# Patient Record
Sex: Female | Born: 1935 | Hispanic: No | Marital: Married | Smoking: Never smoker
Health system: Southern US, Community
[De-identification: ages and names within clinical notes are randomized; demographics above are authoritative.]

## PROBLEM LIST (undated history)

## (undated) DIAGNOSIS — Z923 Personal history of irradiation: Secondary | ICD-10-CM

## (undated) DIAGNOSIS — C801 Malignant (primary) neoplasm, unspecified: Secondary | ICD-10-CM

## (undated) DIAGNOSIS — N390 Urinary tract infection, site not specified: Secondary | ICD-10-CM

## (undated) DIAGNOSIS — C50919 Malignant neoplasm of unspecified site of unspecified female breast: Secondary | ICD-10-CM

## (undated) HISTORY — DX: Urinary tract infection, site not specified: N39.0

---

## 2001-07-21 DIAGNOSIS — C50919 Malignant neoplasm of unspecified site of unspecified female breast: Secondary | ICD-10-CM

## 2001-07-21 HISTORY — PX: BREAST BIOPSY: SHX20

## 2001-07-21 HISTORY — DX: Malignant neoplasm of unspecified site of unspecified female breast: C50.919

## 2004-04-01 ENCOUNTER — Other Ambulatory Visit: Payer: Self-pay

## 2004-09-30 ENCOUNTER — Ambulatory Visit: Payer: Self-pay | Admitting: General Surgery

## 2005-01-08 ENCOUNTER — Ambulatory Visit: Payer: Self-pay | Admitting: Oncology

## 2005-10-13 ENCOUNTER — Ambulatory Visit: Payer: Self-pay | Admitting: General Surgery

## 2006-01-12 ENCOUNTER — Ambulatory Visit: Payer: Self-pay | Admitting: Oncology

## 2006-06-10 ENCOUNTER — Encounter: Payer: Self-pay | Admitting: Unknown Physician Specialty

## 2006-06-20 ENCOUNTER — Encounter: Payer: Self-pay | Admitting: Unknown Physician Specialty

## 2006-07-21 ENCOUNTER — Encounter: Payer: Self-pay | Admitting: Unknown Physician Specialty

## 2006-10-16 ENCOUNTER — Ambulatory Visit: Payer: Self-pay | Admitting: General Surgery

## 2006-11-05 ENCOUNTER — Ambulatory Visit: Payer: Self-pay

## 2007-09-08 ENCOUNTER — Ambulatory Visit: Payer: Self-pay | Admitting: Ophthalmology

## 2007-09-21 ENCOUNTER — Ambulatory Visit: Payer: Self-pay | Admitting: Ophthalmology

## 2007-10-18 ENCOUNTER — Ambulatory Visit: Payer: Self-pay | Admitting: General Surgery

## 2007-12-14 ENCOUNTER — Other Ambulatory Visit: Payer: Self-pay

## 2007-12-14 ENCOUNTER — Emergency Department: Payer: Self-pay | Admitting: Emergency Medicine

## 2007-12-21 ENCOUNTER — Ambulatory Visit: Payer: Self-pay | Admitting: Internal Medicine

## 2007-12-29 ENCOUNTER — Ambulatory Visit: Payer: Self-pay | Admitting: Internal Medicine

## 2008-06-30 ENCOUNTER — Ambulatory Visit: Payer: Self-pay | Admitting: Internal Medicine

## 2008-10-18 ENCOUNTER — Ambulatory Visit: Payer: Self-pay | Admitting: Internal Medicine

## 2008-10-24 ENCOUNTER — Ambulatory Visit: Payer: Self-pay | Admitting: Ophthalmology

## 2008-10-24 ENCOUNTER — Ambulatory Visit: Payer: Self-pay | Admitting: Cardiology

## 2008-10-30 ENCOUNTER — Ambulatory Visit: Payer: Self-pay | Admitting: Ophthalmology

## 2009-10-24 ENCOUNTER — Ambulatory Visit: Payer: Self-pay | Admitting: Internal Medicine

## 2010-10-28 ENCOUNTER — Ambulatory Visit: Payer: Self-pay | Admitting: Internal Medicine

## 2011-10-06 ENCOUNTER — Ambulatory Visit: Payer: Self-pay | Admitting: Internal Medicine

## 2011-11-07 ENCOUNTER — Ambulatory Visit: Payer: Self-pay | Admitting: Internal Medicine

## 2012-04-09 DIAGNOSIS — N390 Urinary tract infection, site not specified: Secondary | ICD-10-CM | POA: Insufficient documentation

## 2012-04-09 DIAGNOSIS — N3946 Mixed incontinence: Secondary | ICD-10-CM | POA: Insufficient documentation

## 2012-04-09 DIAGNOSIS — R399 Unspecified symptoms and signs involving the genitourinary system: Secondary | ICD-10-CM | POA: Insufficient documentation

## 2012-06-14 ENCOUNTER — Observation Stay: Payer: Self-pay | Admitting: Internal Medicine

## 2012-06-14 LAB — COMPREHENSIVE METABOLIC PANEL
Anion Gap: 8 (ref 7–16)
BUN: 13 mg/dL (ref 7–18)
Calcium, Total: 9.2 mg/dL (ref 8.5–10.1)
Chloride: 98 mmol/L (ref 98–107)
Creatinine: 0.74 mg/dL (ref 0.60–1.30)
EGFR (African American): >60
EGFR (Non-African Amer.): >60
Potassium: 3.8 mmol/L (ref 3.5–5.1)
SGOT(AST): 18 U/L (ref 15–37)
SGPT (ALT): 18 U/L (ref 12–78)
Total Protein: 7.8 g/dL (ref 6.4–8.2)

## 2012-06-14 LAB — CK TOTAL AND CKMB (NOT AT ARMC): CK-MB: 1.1 ng/mL (ref 0.5–3.6)

## 2012-06-14 LAB — CBC WITH DIFFERENTIAL/PLATELET
Basophil #: 0 10*3/uL (ref 0.0–0.1)
Eosinophil #: 0.1 10*3/uL (ref 0.0–0.7)
HCT: 38 % (ref 35.0–47.0)
HGB: 13 g/dL (ref 12.0–16.0)
MCH: 30.9 pg (ref 26.0–34.0)
MCHC: 34.2 g/dL (ref 32.0–36.0)
MCV: 90 fL (ref 80–100)
Monocyte #: 0.7 x10 3/mm (ref 0.2–0.9)
Neutrophil %: 64.9 %
Platelet: 185 10*3/uL (ref 150–440)
RBC: 4.2 10*6/uL (ref 3.80–5.20)
RDW: 12.2 % (ref 11.5–14.5)

## 2012-06-14 LAB — TROPONIN I: Troponin-I: <0.02 ng/mL

## 2012-06-15 LAB — CBC WITH DIFFERENTIAL/PLATELET
Basophil #: 0 10*3/uL (ref 0.0–0.1)
Eosinophil #: 0.1 10*3/uL (ref 0.0–0.7)
Lymphocyte %: 26 %
Monocyte %: 8.9 %
Platelet: 176 10*3/uL (ref 150–440)
RDW: 12.2 % (ref 11.5–14.5)
WBC: 8.2 10*3/uL (ref 3.6–11.0)

## 2012-06-15 LAB — BASIC METABOLIC PANEL
BUN: 12 mg/dL (ref 7–18)
Calcium, Total: 9 mg/dL (ref 8.5–10.1)
Chloride: 102 mmol/L (ref 98–107)
Creatinine: 0.74 mg/dL (ref 0.60–1.30)
EGFR (Non-African Amer.): >60
Glucose: 77 mg/dL (ref 65–99)
Osmolality: 271 (ref 275–301)
Potassium: 3.3 mmol/L — ABNORMAL LOW (ref 3.5–5.1)
Sodium: 136 mmol/L (ref 136–145)

## 2012-06-15 LAB — URINALYSIS, COMPLETE
Bilirubin,UR: NEGATIVE
Glucose,UR: NEGATIVE mg/dL (ref 0–75)
Nitrite: NEGATIVE
Protein: NEGATIVE
RBC,UR: 1 /HPF (ref 0–5)
Specific Gravity: 1.01 (ref 1.003–1.030)
WBC UR: <1 /HPF (ref 0–5)

## 2012-11-10 ENCOUNTER — Ambulatory Visit: Payer: Self-pay | Admitting: Internal Medicine

## 2012-11-22 ENCOUNTER — Ambulatory Visit: Payer: Self-pay | Admitting: Internal Medicine

## 2013-02-23 ENCOUNTER — Ambulatory Visit: Payer: Self-pay | Admitting: Internal Medicine

## 2013-03-10 DIAGNOSIS — M79641 Pain in right hand: Secondary | ICD-10-CM | POA: Insufficient documentation

## 2013-03-11 DIAGNOSIS — G5600 Carpal tunnel syndrome, unspecified upper limb: Secondary | ICD-10-CM | POA: Insufficient documentation

## 2013-03-11 DIAGNOSIS — M19049 Primary osteoarthritis, unspecified hand: Secondary | ICD-10-CM | POA: Insufficient documentation

## 2013-06-20 ENCOUNTER — Ambulatory Visit: Payer: Self-pay | Admitting: Otolaryngology

## 2013-07-22 DIAGNOSIS — M19049 Primary osteoarthritis, unspecified hand: Secondary | ICD-10-CM | POA: Insufficient documentation

## 2013-09-01 DIAGNOSIS — I10 Essential (primary) hypertension: Secondary | ICD-10-CM | POA: Insufficient documentation

## 2013-11-30 ENCOUNTER — Ambulatory Visit: Payer: Self-pay | Admitting: Internal Medicine

## 2014-06-12 DIAGNOSIS — D352 Benign neoplasm of pituitary gland: Secondary | ICD-10-CM | POA: Insufficient documentation

## 2014-06-12 DIAGNOSIS — E7800 Pure hypercholesterolemia, unspecified: Secondary | ICD-10-CM | POA: Insufficient documentation

## 2014-06-12 DIAGNOSIS — M8580 Other specified disorders of bone density and structure, unspecified site: Secondary | ICD-10-CM | POA: Insufficient documentation

## 2014-06-12 DIAGNOSIS — D510 Vitamin B12 deficiency anemia due to intrinsic factor deficiency: Secondary | ICD-10-CM | POA: Insufficient documentation

## 2014-10-04 ENCOUNTER — Encounter: Payer: Self-pay | Admitting: Podiatry

## 2014-10-04 ENCOUNTER — Ambulatory Visit (INDEPENDENT_AMBULATORY_CARE_PROVIDER_SITE_OTHER): Payer: Medicare Other

## 2014-10-04 ENCOUNTER — Ambulatory Visit (INDEPENDENT_AMBULATORY_CARE_PROVIDER_SITE_OTHER): Payer: Medicare Other | Admitting: Podiatry

## 2014-10-04 VITALS — BP 141/79 | HR 82 | Resp 16 | Ht 66.0 in | Wt 155.0 lb

## 2014-10-04 DIAGNOSIS — M779 Enthesopathy, unspecified: Secondary | ICD-10-CM

## 2014-10-04 DIAGNOSIS — S92301A Fracture of unspecified metatarsal bone(s), right foot, initial encounter for closed fracture: Secondary | ICD-10-CM

## 2014-10-04 NOTE — Progress Notes (Signed)
   Subjective:    Patient ID: Adriana Moreno, female    DOB: 1933/12/26, 81 y.o.   MRN: [id number]  HPI Comments: "I have a pain on top of my foot"  Patient c/o aching dorsal forefoot right for about 3 weeks. She was playing golf and stepped in a hole and twisted the foot. She noticed it was bruised along the side. She has been soaking and wearing a brace. She also tried some arch supports. No help.  Foot Pain Associated symptoms include arthralgias and myalgias.      Review of Systems  HENT: Positive for hearing loss and tinnitus.   Musculoskeletal: Positive for myalgias, back pain, arthralgias and gait problem.  Neurological: Positive for dizziness.  All other systems reviewed and are negative.      Objective:   Physical Exam: I have reviewed her past medical history medications allergies surgery social history and review of systems. Pulses are strongly palpable bilateral. Decreased sensorium per vibratory sensation bilateral to the level of the knee. Deep tendon reflexes are intact bilateral muscle strength +5 over 5 dorsiflexion plantar flexors and inverters and everters all intrinsic musculature is intact. Orthopedic evaluation demonstrates all joints distal to the ankle had full range of motion without crepitation. She has mild tenderness on range of motion of the second and third metatarsophalangeal joints of the right foot to she states this is much improved. She does have pain on palpation at the fifth metatarsal base and the fourth metatarsal base without overlying edema or ecchymosis. Radiographic evaluation is suggested for a possible stress fracture of the base of the fifth metatarsal and the fourth metatarsal of the right foot. No comminution noted displacement is noted.        Assessment & Plan:  Assessment: Stress fracture bases of the fourth and fifth metatarsals of the right foot.  Plan: Place her in a Darco shoe. Follow up with her in 1 month for another set of  x-rays.

## 2014-11-01 ENCOUNTER — Ambulatory Visit: Payer: Medicare Other | Admitting: Podiatry

## 2014-11-03 ENCOUNTER — Other Ambulatory Visit: Payer: Self-pay | Admitting: Internal Medicine

## 2014-11-03 DIAGNOSIS — Z1231 Encounter for screening mammogram for malignant neoplasm of breast: Secondary | ICD-10-CM

## 2014-11-07 NOTE — H&P (Signed)
PATIENT NAME:  Adriana Moreno, Adriana Moreno MR#:  [id number] DATE OF BIRTH:  07-31-1933  DATE OF ADMISSION:  06/14/2012  REASON FOR ADMISSION: Neck pain with acute memory loss.   PRIMARY CARE PHYSICIAN: Dr. Lisette Grinder, III   HISTORY OF PRESENT ILLNESS: Mrs. Adriana Moreno is a very nice 81 year old female who has a history of high blood pressure, breast cancer status post lumpectomy and radiation,  chronic urinary tract infections for which she takes chronic antibiotics, osteoarthritis, and gastroesophageal reflux disease. She came to visit [place] today due to severe pain of the neck with stiffness that has been going on for over two weeks with worsening today. The patient states that right now she does not remember anything of what happened and why she is here, I discussed the case with the ER physician and the husband. Apparently the patient had the pain, again, for two weeks and it has been getting worse with radiation to the back of the head, to the top of the head, and significant stiffness. It was believed that she may have some torticollis.  At the [facility] office she was told to come and get checked in the ER due to worsening of the pain and the headache just to rule out any type of dissection of the posterior circulation.  The patient came over here. She had a CT angio of the neck, which was negative and did not show any significant abnormalities.  After that she was given some Valium, 2.5 mg IV, and the patient started getting confused right after that. She also had an order to get 5 mg p.o. which it says completed on the computer. I could not establish if this medication was given or not,  but it seems like she did receive it. After that, the patient started getting really confused and had significant acute amnesia of the events of today. For that reason I was asked to admit the patient. Today when I spoke to her the patient is actually getting better. She is starting to have more  clearing of her head but she still does not know what day it is today. After directing she knows that it is November. She knows that it is several days until Thanksgiving, but she did not know the exact date. She knew who she was and where she was, and she states that she does not necessarily remember why she was coming. She knew that she had pain in her neck but did not remember getting worse or coming to the doctor this morning. I had a long conversation with the husband who said that this has happened before, not to this degree, but she was still confused for a day about three years ago and maybe about six years ago. He does not remember the specifics of that. It could have been after a urine infection, it could have been after initiating new medications. Overall the patient is fine and she states that her headache started today on the top of the head down to the posterior area of her head, running down to her neck and it resolved after the medication was given "Valium".   REVIEW OF SYSTEMS: CONSTITUTIONAL: Denies any fever, weakness, weight loss, or weight gain. Positive neck pain. EYES: Denies any blurry vision, double vision, cataracts, or inflammation. ENT: No tinnitus. No epistaxis. No discharge. No difficulty swallowing. No drop of the corners of the mouth. No difficulty closing or opening her eyes. RESPIRATORY: No cough, wheezing, hemoptysis, or pneumonia. CARDIOVASCULAR: No  chest pain, orthopnea, edema, arrhythmias, or palpitations. GI: No nausea or vomiting. No abdominal pain. No rectal bleeding or melena. GU: No dysuria or hematuria. No frequency or incontinence. The patient has history of chronic UTIs, but they have been controlled and she has not had one in the last couple of years after she started her treatment with nitrofurantoin. GYN: History of breast cancer status post lumpectomy and radiation. ENDOCRINE: No polyuria, polydipsia, or polyphagia. No cold or heat intolerance. HEME/LYMPH: No  anemia, easy bruising, or bleeding. No swollen glands. SKIN: Without any significant rashes or lesions. MUSCULOSKELETAL: Positive neck pain. Positive for arthritis of her hands and knees. NEUROLOGIC: No numbness or tingling. No weakness. No dysarthria. No dementia. No CVAs or transient ischemic attacks. Positive for headache as mentioned above. PSYCHIATRIC: No anxiety or depression.   PAST MEDICAL HISTORY:  1. Hypertension.  2. Breast cancer.  3. Chronic urinary tract infections.  4. Seasonal allergies.  5. Osteoarthritis.  6. Gastroesophageal reflux disease.   ALLERGIES: The patient is allergic to codeine, adhesive Band-Aids, and Eryc  which give her a rash.   CURRENT MEDICATIONS:  1. Aspirin 81 mg daily. 2. Celebrex 200 mg 2 times a day.  3. Tramadol 50 mg every 4 hours p.r.n. pain.  4. Zyrtec 10 mg once daily.  5. Indapamide 1.5 mg once daily. 6. Omeprazole 20 mg once daily. 7. Nitrofurantoin 50 mg once daily.  8. Caltrate 600 mg plus vitamin D.   FAMILY HISTORY: Myocardial infarction in her father. Her mother had uterine cancer. No strokes.   SOCIAL HISTORY: Denies any tobacco, alcohol, or drug use. She lives with her husband. They are both retired.   PAST SURGICAL HISTORY:  1. Hand surgery for her osteoarthritis.  2. Hysterectomy.  3. Lumpectomy followed by radiation due to breast cancer.   PHYSICAL EXAMINATION:  VITAL SIGNS: Blood pressure 179/98, pulse 95, respiratory rate 20, temperature 97.3.   GENERAL: The patient is alert and oriented x 2. She knows where she is. She knows her name but she does not know exactly the date. She is able to be redirected. Her speech is clear. No dysarthria. It is goal directed. She does have some mild amnesia.   HEENT: Her pupils are equal and reactive. Extraocular movements are intact. Mucosa are moist. There are no oral lesions. No oropharyngeal exudates. Anicteric sclerae. Conjunctivae are pink.   NECK: Supple. No JVD. No thyromegaly.  No adenopathy. No carotid bruits. Some pain to palpation of the scalene and trapesioum muscles. They are little bit tight but there is no significant stiffness.   CARDIOVASCULAR: Regular rate and rhythm. No murmurs, rubs, or gallops are appreciated. No displacement of PMI. No thrills.   LUNGS: Clear without any wheezing or crepitus. No use of accessory muscles.   ABDOMEN: Soft, nontender, nondistended. No hepatosplenomegaly. No masses. Bowel sounds are positive.   EXTREMITIES: No edema, no cyanosis, no clubbing.   GU: Deferred.   SKIN: No rashes or petechiae.   LYMPHATIC: Negative for lymphadenopathy in neck or supraclavicular areas.   NEUROLOGIC: Cranial nerves II through XII are intact. Strength is five out of five in four extremities. No nystagmus. No difficulty doing rapid alternating movements. No dysmetria. Overall grossly normal exam. Sensation is normal distally. Capillary refill is less than 3 seconds.   PSYCH: Mood is normal without any signs of depression. The patient is alert and oriented times two as mentioned above.   MUSCULOSKELETAL: Negative for significant joint deformity or joint effusions.  RESULTS:  CT angiogram of the neck is normal. CT scan of the head: Unable to see detail due to IV contrast but no significant acute abnormalities. Chest x-ray within normal limits. It showed some hyperinflation compatible with chronic obstructive pulmonary disease although the patient never smoked and does not have any respiratory problems. Her sodium is 134. Her creatinine is 0.74, glucose 84. Electrolytes are overall within normal limits. Troponins are negative. White count is 8.4, hemoglobin is 13, and platelets are 185. Her EKG shows normal sinus rhythm. No ST depression or elevation. No signs of significant left ventricular hypertrophy.   ASSESSMENT AND PLAN: 81 year old female with history of neck pain who also has hypertension breast cancer, chronic UTIs, allergies,  osteoarthritis, and gastroesophageal reflux disease.  She came to the ER to rule out any vascular problems in her neck as sent by [facility] orthopedics.  The patient had a normal CT angio.  She received Valium and after that had significant acute memory loss and confusion. The patient is admitted for observation.  1. Neck pain, likely due to musculoskeletal problem, torticollis.  The patient received Valium and actually her pain went away.  Her neck is more mobile and relaxed and her headache went away. At this moment the only problem is her memory loss. We are going to continue with pain control with tramadol, Tylenol, and I am going to add some methocarbamol which does not have as many side effects as the benzodiazepines.  2. Altered mental status/acute memory loss, likely due to medication management. This does not seem to be a transient ischemic attack or a stroke, although we are going to do neuro checks. The patient had episodes like this that happened three years ago and six years ago. They lasted hours. Again, we do not know the details but she had urine infections and the husband said it could be related to one of those episodes.  If there are no changes in her mental status we are going to order an MRI in the morning but first we are going to let Dr. Gilford Rile check on her since he is her primary care doctor and figure out from there if she requires any more testing. She already had a  CT angio of the neck and I am not going to order an ultrasound of the neck. Consideration of getting an echocardiogram is there, too, but since the patient had this event related to medication, I doubt that we need to proceed with that all that work-up here as an inpatient.  3. Hypertension. Continue treatment with indapamide. I am not going to try to lower her blood pressure too much or too fast in the event of a transient ischemic attack. We are only going to treat blood pressure with p.r.n. medications if her blood  pressure is above 220.  4. Low sodium of 134 likely due to blood pressure medication indapamide.  5. Gastroesophageal reflux disease. Continue PPI.  6. Chronic urinary tract infections. Continue use of Macrobid.  7. Other medical problems seem to be stable.   TIME SPENT: I spent about 45 minutes with this admission. We are going to sign off the care to Dr. Gilford Rile in the morning. The patient is a FULL CODE.    ____________________________ [place] Sink, MD [id number] D: 06/14/2012 16:50:44 ET T: 06/14/2012 17:13:48 ET JOB#: [id number]  cc:  Sink, MD, <Dictator> John B. Sarina Ser, MD Carly Sabo America Brown MD ELECTRONICALLY SIGNED 06/15/2012 12:28

## 2014-12-04 ENCOUNTER — Ambulatory Visit
Admission: RE | Admit: 2014-12-04 | Discharge: 2014-12-04 | Disposition: A | Discharge status: Home / Self Care | Payer: Medicare Other | Source: Ambulatory Visit | Attending: Internal Medicine | Admitting: Internal Medicine

## 2014-12-04 ENCOUNTER — Other Ambulatory Visit: Payer: Self-pay | Admitting: Internal Medicine

## 2014-12-04 DIAGNOSIS — Z1231 Encounter for screening mammogram for malignant neoplasm of breast: Secondary | ICD-10-CM | POA: Insufficient documentation

## 2014-12-04 HISTORY — DX: Malignant (primary) neoplasm, unspecified: C80.1

## 2014-12-11 DIAGNOSIS — E559 Vitamin D deficiency, unspecified: Secondary | ICD-10-CM | POA: Insufficient documentation

## 2014-12-20 ENCOUNTER — Ambulatory Visit (INDEPENDENT_AMBULATORY_CARE_PROVIDER_SITE_OTHER): Payer: Medicare Other

## 2014-12-20 ENCOUNTER — Encounter: Payer: Self-pay | Admitting: Podiatry

## 2014-12-20 ENCOUNTER — Ambulatory Visit (INDEPENDENT_AMBULATORY_CARE_PROVIDER_SITE_OTHER): Payer: Medicare Other | Admitting: Podiatry

## 2014-12-20 VITALS — Ht 66.0 in | Wt 145.0 lb

## 2014-12-20 DIAGNOSIS — G5761 Lesion of plantar nerve, right lower limb: Secondary | ICD-10-CM | POA: Diagnosis not present

## 2014-12-20 DIAGNOSIS — M79671 Pain in right foot: Secondary | ICD-10-CM | POA: Diagnosis not present

## 2014-12-20 NOTE — Progress Notes (Signed)
She presents today for follow-up of her fracture fourth metatarsal base of the right foot. She states that the pain feels about the same that it did accept is a little more toward the toes. She was unable to wear the Darco shoe due to falls.  Objective: Vital signs stable alert and oriented 3. She has pain on palpation to the base of the fourth metatarsal but minimally so. She has pain on palpation to the third interdigital space right foot. Radiograph confirms slowly healing dress fracture fourth met base right.  Assessment: Well-healing fracture fourth met base right and mortise neuroma third interdigital space right.  Plan: Kenalog injection third and general space of the right foot follow up with her in 3-4 weeks.

## 2015-01-10 ENCOUNTER — Encounter: Payer: Self-pay | Admitting: Podiatry

## 2015-01-10 ENCOUNTER — Ambulatory Visit (INDEPENDENT_AMBULATORY_CARE_PROVIDER_SITE_OTHER): Payer: Medicare Other | Admitting: Podiatry

## 2015-01-10 VITALS — BP 157/85 | HR 67 | Resp 16

## 2015-01-10 DIAGNOSIS — G5761 Lesion of plantar nerve, right lower limb: Secondary | ICD-10-CM

## 2015-01-10 NOTE — Progress Notes (Signed)
She presents today for follow-up of a neuroma to the third interdigital space of the right foot. She states that the last injection helped considerably however it seems to have worn off. She states that her pain has returned and like to consider another injection because of an upcoming engagement that she has.  Objective: Vital signs are stable alert and oriented 3. Pulses are palpable right foot. Palpable Mulder's click to the third interdigital space of the right foot.  Assessment: Morton's neuroma third interdigital space right foot.  Plan: Injected Kenalog to the third interdigital space of the right foot. We did discuss the need for alcohol injections which she would like to consider in the future.

## 2015-02-07 ENCOUNTER — Ambulatory Visit: Payer: Medicare Other | Admitting: Podiatry

## 2015-02-14 ENCOUNTER — Ambulatory Visit (INDEPENDENT_AMBULATORY_CARE_PROVIDER_SITE_OTHER): Payer: Medicare Other | Admitting: Podiatry

## 2015-02-14 VITALS — BP 143/76 | HR 78 | Resp 16

## 2015-02-14 DIAGNOSIS — G5761 Lesion of plantar nerve, right lower limb: Secondary | ICD-10-CM | POA: Diagnosis not present

## 2015-02-14 NOTE — Progress Notes (Signed)
She presents today for follow-up of her neuroma to her right foot. She states that does not seem to be getting any better with the cortisone injection. She would like to try different treatment plan if at all possible.  Objective: I'll signs are stable she is alert and oriented 3. Pulses are palpable. She has a palpable Mulder's click to the third interdigital space of the right foot.  Assessment: Chronic neuroma third interdigital space right foot.  Plan: Injected her first dose of dehydrated alcohol to the third interdigital space the right foot. Follow up with her in 3 weeks.

## 2015-03-07 ENCOUNTER — Encounter: Payer: Self-pay | Admitting: Podiatry

## 2015-03-07 ENCOUNTER — Ambulatory Visit (INDEPENDENT_AMBULATORY_CARE_PROVIDER_SITE_OTHER): Payer: Medicare Other

## 2015-03-07 ENCOUNTER — Ambulatory Visit (INDEPENDENT_AMBULATORY_CARE_PROVIDER_SITE_OTHER): Payer: Medicare Other | Admitting: Podiatry

## 2015-03-07 VITALS — BP 168/94 | HR 75 | Resp 16

## 2015-03-07 DIAGNOSIS — G5761 Lesion of plantar nerve, right lower limb: Secondary | ICD-10-CM

## 2015-03-07 DIAGNOSIS — S92301A Fracture of unspecified metatarsal bone(s), right foot, initial encounter for closed fracture: Secondary | ICD-10-CM | POA: Diagnosis not present

## 2015-03-07 MED ORDER — TRAMADOL HCL 50 MG PO TABS: 50.0000 mg | ORAL_TABLET | Freq: Three times a day (TID) | ORAL | Status: DC | PRN

## 2015-03-07 NOTE — Progress Notes (Signed)
She presents today for follow-up of her Morton's neuroma third interdigital space right foot. Today we'll be her second dose of dehydrated alcohol. She is questioning whether this is going to work or not she states that seems to be somewhat worse than it was previously she's also stating that there is a new area of tenderness as she points to the first intermetatarsal space of the right foot. She denies any trauma but states that it just started hurting and she can embark hardly bear weight on it.  Objective: 81 year old white female in no acute distress alert and oriented vital signs are stable. Pulses are strongly palpable right. Neurologic sensorium is intact with palpable Mulder's click to the third interdigital space of the right foot. She has tenderness on palpation to these second as well as the intermetatarsal space right foot. Radiographs 3 views of the right foot taken in the office today do not demonstrate any type of osseus abnormalities that are consistent with her symptoms. Osteoarthritic changes of the first metatarsal and the medial cuneiform joint with dorsal spurring is noted. This questions whether she has entrapment of the deep peroneal nerve.  Assessment: Morton's neuroma with no improvement third interdigital space right foot. Probable neuritis first intermetatarsal space right foot with questionable deep peroneal nerve neuritis.  Plan: Discussed etiology pathology conservative versus surgical therapies injected her second dose of dehydrated alcohol to the third interdigital space of the right foot. I injected 20 mg of cortisone to the first intermetatarsal space of the right foot for the neuritis. I will follow up with her in 3 weeks for reinjection.

## 2015-03-28 ENCOUNTER — Encounter: Payer: Self-pay | Admitting: Podiatry

## 2015-03-28 ENCOUNTER — Ambulatory Visit (INDEPENDENT_AMBULATORY_CARE_PROVIDER_SITE_OTHER): Payer: Medicare Other | Admitting: Podiatry

## 2015-03-28 VITALS — BP 131/74 | HR 82 | Resp 18

## 2015-03-28 DIAGNOSIS — G5761 Lesion of plantar nerve, right lower limb: Secondary | ICD-10-CM

## 2015-03-28 DIAGNOSIS — G5791 Unspecified mononeuropathy of right lower limb: Secondary | ICD-10-CM

## 2015-03-28 NOTE — Progress Notes (Signed)
She presents today after her second injection of dehydrated alcohol for her neuroma right foot. She states that I can see is slowly getting better.  Objective: Vital signs are stable she is alert and oriented 3 pulses are palpable. She still has some tenderness on palpation of the neuroma third interdigital space of the right foot.  Assessment: Neuroma third interdigital space right foot.  Plan: Injected her third dose of dehydrated alcohol to the third interdigital space of the right foot today I will follow up with her in 3 weeks.

## 2015-04-18 ENCOUNTER — Encounter: Payer: Self-pay | Admitting: Podiatry

## 2015-04-18 ENCOUNTER — Ambulatory Visit (INDEPENDENT_AMBULATORY_CARE_PROVIDER_SITE_OTHER): Payer: Medicare Other | Admitting: Podiatry

## 2015-04-18 VITALS — BP 94/50 | HR 90 | Resp 16

## 2015-04-18 DIAGNOSIS — G5761 Lesion of plantar nerve, right lower limb: Secondary | ICD-10-CM

## 2015-04-18 NOTE — Progress Notes (Signed)
She presents today for follow-up of a neuroma to the third interdigital space of the right foot. She states that I think it's getting better she states that some days are worse than others. She presents today for her fourth injection dehydrated alcohol.  Objective: Vital signs are stable she is alert and oriented 3 she still has pain on palpation to the third interdigital space however I no longer feel a Mulder's click.  Assessment: Neuroma third interdigital space right foot just between the metatarsal heads.  Plan: Injected her fourth dose of dehydrated alcohol to the point of maximal tenderness today right foot. Follow up with her in 3 weeks Roselind Messier DPM

## 2015-05-09 ENCOUNTER — Encounter: Payer: Self-pay | Admitting: Podiatry

## 2015-05-09 ENCOUNTER — Ambulatory Visit (INDEPENDENT_AMBULATORY_CARE_PROVIDER_SITE_OTHER): Payer: Medicare Other | Admitting: Podiatry

## 2015-05-09 VITALS — BP 151/91 | HR 73 | Resp 16

## 2015-05-09 DIAGNOSIS — G5761 Lesion of plantar nerve, right lower limb: Secondary | ICD-10-CM

## 2015-05-09 NOTE — Progress Notes (Signed)
She presents today for her fifth injection to the neuroma third interdigital space of the right foot. She states that after the fourth injection she really not cannot tell any difference at all.  Objective: Vital signs are stable she is alert and oriented 3 palpable Mulder's click third interdigital space right foot. Pulses are strongly palpable.  Assessment: Neuroma third interdigital space right foot. Chronic nature.  Plan: Injected her fifth dose of dehydrated alcohol to the third interdigital space of the right foot today after sterile Betadine skin prep. We also discussed the possible need for surgical intervention should this with an sixth injection not work as well. I will follow-up with her in 3 weeks for discussion.

## 2015-05-30 ENCOUNTER — Encounter: Payer: Self-pay | Admitting: Podiatry

## 2015-05-30 ENCOUNTER — Ambulatory Visit (INDEPENDENT_AMBULATORY_CARE_PROVIDER_SITE_OTHER): Payer: Medicare Other | Admitting: Podiatry

## 2015-05-30 VITALS — BP 149/82 | HR 73 | Resp 16

## 2015-05-30 DIAGNOSIS — G5761 Lesion of plantar nerve, right lower limb: Secondary | ICD-10-CM

## 2015-05-30 NOTE — Progress Notes (Signed)
She presents today for follow-up of neuroma third interdigital space of the right foot. She states it is not getting any better and I will like to consider surgical excision. She denies changes in her past medical history medications and allergies. She states that she is in good health with no complications. States that she just had surgery a little over a year ago for her fingers.  Objective: Vital signs are stable alert and oriented 3. Pulses are palpable. She has a palpable Mulder's click third interdigital space of the right foot. Her toe deformities are noted. Muscle strength is normal right foot.  Assessment: Chronic neuroma third interdigital space right foot.  Plan: Injected dehydrated alcohol today. We discussed the need for surgical intervention should this alcohol injection not work. We discussed surgical excision neuroma third interdigital space of the right foot under general anesthesia. She understands this and is amenable to it we discussed possible postop complications which may include but are not limited to postop pain bleeding swelling infection recurrence and need for further surgery loss of digit also for limb loss of life. She signed all 3 pages of the consent form and we will try to schedule surgery prior to the holidays.

## 2015-05-30 NOTE — Patient Instructions (Signed)
Pre-Operative Instructions  Congratulations, you have decided to take an important step to improving your quality of life.  You can be assured that the doctors of [facility] will be with you every step of the way.  1. Plan to be at the surgery center/hospital at least 1 (one) hour prior to your scheduled time unless otherwise directed by the surgical center/hospital staff.  You must have a responsible adult accompany you, remain during the surgery and drive you home.  Make sure you have directions to the surgical center/hospital and know how to get there on time. 2. For hospital based surgery you will need to obtain a history and physical form from your family physician within 1 month prior to the date of surgery- we will give you a form for you primary physician.  3. We make every effort to accommodate the date you request for surgery.  There are however, times where surgery dates or times have to be moved.  We will contact you as soon as possible if a change in schedule is required.   4. No Aspirin/Ibuprofen for one week before surgery.  If you are on aspirin, any non-steroidal anti-inflammatory medications (Mobic, Aleve, Ibuprofen) you should stop taking it 7 days prior to your surgery.  You make take Tylenol  For pain prior to surgery.  5. Medications- If you are taking daily heart and blood pressure medications, seizure, reflux, allergy, asthma, anxiety, pain or diabetes medications, make sure the surgery center/hospital is aware before the day of surgery so they may notify you which medications to take or avoid the day of surgery. 6. No food or drink after midnight the night before surgery unless directed otherwise by surgical center/hospital staff. 7. No alcoholic beverages 24 hours prior to surgery.  No smoking 24 hours prior to or 24 hours after surgery. 8. Wear loose pants or shorts- loose enough to fit over bandages, boots, and casts. 9. No slip on shoes, sneakers are best. 10. Bring  your boot with you to the surgery center/hospital.  Also bring crutches or a walker if your physician has prescribed it for you.  If you do not have this equipment, it will be provided for you after surgery. 11. If you have not been contracted by the surgery center/hospital by the day before your surgery, call to confirm the date and time of your surgery. 12. Leave-time from work may vary depending on the type of surgery you have.  Appropriate arrangements should be made prior to surgery with your employer. 13. Prescriptions will be provided immediately following surgery by your doctor.  Have these filled as soon as possible after surgery and take the medication as directed. 14. Remove nail polish on the operative foot. 15. Wash the night before surgery.  The night before surgery wash the foot and leg well with the antibacterial soap provided and water paying special attention to beneath the toenails and in between the toes.  Rinse thoroughly with water and dry well with a towel.  Perform this wash unless told not to do so by your physician.  Enclosed: 1 Ice pack (please put in freezer the night before surgery)   1 Hibiclens skin cleaner   Pre-op Instructions  If you have any questions regarding the instructions, do not hesitate to call our office.  [place]: [address] [place], [place] [address] [phone]  [place]: [address] [place], [place] [address] [phone]  [place]: [address]  [place], [place] [address] [phone]  Dr. Richard   Tuchman DPM, Dr. Norman Regal DPM Dr. Richard Sikora DPM, Dr. M. Todd Hyatt DPM, Dr. Kathryn Egerton DPM 

## 2015-06-07 ENCOUNTER — Telehealth: Payer: Self-pay | Admitting: *Deleted

## 2015-06-07 NOTE — Telephone Encounter (Signed)
Authorization was obtained for surgery scheduled for 06/21/2015 for cpt 28080.  Authorization number is [id number].  Authorization was faxed to Caren Griffins at [facility].

## 2015-06-21 ENCOUNTER — Other Ambulatory Visit: Payer: Self-pay | Admitting: Podiatry

## 2015-06-21 DIAGNOSIS — G5760 Lesion of plantar nerve, unspecified lower limb: Secondary | ICD-10-CM | POA: Diagnosis not present

## 2015-06-21 MED ORDER — TRAMADOL HCL 50 MG PO TABS: 50.0000 mg | ORAL_TABLET | Freq: Three times a day (TID) | ORAL | Status: AC | PRN

## 2015-06-21 MED ORDER — CEPHALEXIN 500 MG PO CAPS: 500.0000 mg | ORAL_CAPSULE | Freq: Three times a day (TID) | ORAL | Status: DC

## 2015-06-27 ENCOUNTER — Encounter: Payer: Self-pay | Admitting: Podiatry

## 2015-06-27 ENCOUNTER — Ambulatory Visit (INDEPENDENT_AMBULATORY_CARE_PROVIDER_SITE_OTHER): Payer: Medicare Other | Admitting: Podiatry

## 2015-06-27 VITALS — BP 151/87 | HR 85 | Resp 18

## 2015-06-27 DIAGNOSIS — Z9889 Other specified postprocedural states: Secondary | ICD-10-CM

## 2015-06-27 DIAGNOSIS — G5761 Lesion of plantar nerve, right lower limb: Secondary | ICD-10-CM

## 2015-06-27 NOTE — Progress Notes (Signed)
She presents today for follow-up of a neurectomy third interdigital space right foot. She denies fever chills nausea vomiting muscle aches and pains and states that he really has not hurt.  Objective: Vital signs stable alert and 3. Pulses are strongly palpable. Neurologic sensorium is intact. Sutures are intact and margins appear to be well coapted. No signs of infection.  Assessment: Well-healing surgical foot right.  Plan: Redress today dry sterile compressive dressing. Continue to keep this dry and clean as well as elevated follow-up with me in 1 week for suture removal.

## 2015-07-09 ENCOUNTER — Ambulatory Visit (INDEPENDENT_AMBULATORY_CARE_PROVIDER_SITE_OTHER): Payer: Medicare Other | Admitting: Podiatry

## 2015-07-09 DIAGNOSIS — G5761 Lesion of plantar nerve, right lower limb: Secondary | ICD-10-CM

## 2015-07-09 DIAGNOSIS — M79673 Pain in unspecified foot: Secondary | ICD-10-CM

## 2015-07-09 DIAGNOSIS — Z9889 Other specified postprocedural states: Secondary | ICD-10-CM

## 2015-07-09 NOTE — Progress Notes (Signed)
She presents today little more than 2 weeks status post neurectomy third interdigital space right foot. She states that the foot feels much better than it did previously.  Objective: All signs are stable she is alert and 3. Sutures are in place margins well coapted with removed sutures today March is remaining well coapted.  Assessment: Resolving neurectomy third interspace right foot.  Plan: Sutures were removed today and I did discuss with her in great detail neuropathy and his treatment options. I will follow-up with her in 2-3 weeks. I placed her in a compression garment as well as a Darco shoe.

## 2015-07-19 ENCOUNTER — Encounter: Payer: Self-pay | Admitting: Podiatry

## 2015-07-19 NOTE — Progress Notes (Signed)
DOS 06-21-15  Neurectomy 3rd interspace right

## 2015-07-25 ENCOUNTER — Encounter: Payer: Self-pay | Admitting: Podiatry

## 2015-07-25 ENCOUNTER — Ambulatory Visit (INDEPENDENT_AMBULATORY_CARE_PROVIDER_SITE_OTHER): Payer: Medicare Other

## 2015-07-25 ENCOUNTER — Ambulatory Visit (INDEPENDENT_AMBULATORY_CARE_PROVIDER_SITE_OTHER): Payer: Medicare Other | Admitting: Podiatry

## 2015-07-25 VITALS — BP 87/61 | HR 81 | Resp 18

## 2015-07-25 DIAGNOSIS — R52 Pain, unspecified: Secondary | ICD-10-CM | POA: Diagnosis not present

## 2015-07-25 DIAGNOSIS — G5761 Lesion of plantar nerve, right lower limb: Secondary | ICD-10-CM

## 2015-07-25 DIAGNOSIS — Z9889 Other specified postprocedural states: Secondary | ICD-10-CM

## 2015-07-25 NOTE — Progress Notes (Signed)
She presents today for follow-up of her neuroma surgery. Date of surgery 06/21/2015. She states that it feels like it did prior to surgery. And it was black and blue.  Objective: Vital signs are stable she is alert and oriented 3. No erythema edema cellulitis drainage or odor. Pulses are strongly palpable. On the reproducible pain is noted to be proximal in the intermetatarsal space for attacks end of the nerve into the muscle. She also has pain on palpation of the second metatarsal. Radiographs taken today confirm that there is no fracture to the metatarsal.  Assessment: Neuroma third interdigital space right foot.  Plan: We dispensed metatarsal pads to help alleviate symptoms in her forefoot. I will follow-up with her in the near future.

## 2015-08-03 ENCOUNTER — Encounter: Payer: Self-pay | Admitting: Podiatry

## 2015-08-29 ENCOUNTER — Ambulatory Visit (INDEPENDENT_AMBULATORY_CARE_PROVIDER_SITE_OTHER): Payer: Medicare Other | Admitting: Podiatry

## 2015-08-29 ENCOUNTER — Encounter: Payer: Self-pay | Admitting: Podiatry

## 2015-08-29 VITALS — BP 146/86 | HR 80 | Resp 18

## 2015-08-29 DIAGNOSIS — G5761 Lesion of plantar nerve, right lower limb: Secondary | ICD-10-CM | POA: Diagnosis not present

## 2015-08-29 DIAGNOSIS — G5781 Other specified mononeuropathies of right lower limb: Secondary | ICD-10-CM

## 2015-08-29 NOTE — Progress Notes (Signed)
She presents today for chief complaint of pain to the dorsal aspect of her right foot between her second and third toes. She had a neuroma removed between her third and fourth digits a while back and is done very well from that but still has some tenderness in that area. She presents today complaining of pain to the dorsal aspect of the foot.  Objective: Vital signs are stable she is alert and oriented 3. Pulses remain palpable. She has minimal tenderness on palpation of the third interdigital space with a surgery was previously performed. She does have pain on palpation to the second interdigital space and a palpable Mulder's click. I did review old radiographs once again to make sure that we did not have any fracture to the second metatarsal particularly since with swelling in the forefoot.  Assessment: Neuroma second interdigital space right foot.  Plan: I injected today with dexamethasone Kenalog and local anesthetic follow-up with her 1 month.

## 2015-09-26 ENCOUNTER — Encounter: Payer: Self-pay | Admitting: Podiatry

## 2015-09-26 ENCOUNTER — Encounter: Payer: Medicare Other | Admitting: Podiatry

## 2015-09-26 ENCOUNTER — Ambulatory Visit (INDEPENDENT_AMBULATORY_CARE_PROVIDER_SITE_OTHER): Payer: Medicare Other | Admitting: Podiatry

## 2015-09-26 DIAGNOSIS — G5781 Other specified mononeuropathies of right lower limb: Secondary | ICD-10-CM

## 2015-09-26 DIAGNOSIS — G5761 Lesion of plantar nerve, right lower limb: Secondary | ICD-10-CM

## 2015-09-26 NOTE — Progress Notes (Signed)
She presents today for follow-up of a neuroma between her neck and and third digits of her right foot. He previously had a neuroma removed from her third and fourth interdigital area several months ago and has since developed a mass overlying the incision site. She states that after the last steroid injection between the second third toes is starting to fill little better for a while before she started developing pain along the medial and lateral aspect of the hallux.  Objective: Vital signs stable alert and oriented 3. Pulses are palpable. Soft mass measuring the size of a dime overlying her old incision site between the third and fourth digits of the right foot. This appears to be a ganglion cyst or small seroma. This is not neuroma. She also has pain on palpation between the second third metatarsals and the first and second metatarsals she also has pain on palpation of the second metatarsal and first metatarsal cuneiform joint. This is an area of osteoarthritis and potential site for deep peroneal nerve entrapment.  Assessment: Neuroma second interdigital space and first interdigital space of the right foot.  Plan: I injected the area today with Kenalog and local anesthetic to both of these sites proximally. I will follow up with her in 4 weeks. May need to consider an MRI this doesn't better.

## 2015-10-24 ENCOUNTER — Encounter: Payer: Self-pay | Admitting: Podiatry

## 2015-10-24 ENCOUNTER — Ambulatory Visit (INDEPENDENT_AMBULATORY_CARE_PROVIDER_SITE_OTHER): Payer: Medicare Other | Admitting: Podiatry

## 2015-10-24 VITALS — BP 129/82 | HR 77 | Resp 16

## 2015-10-24 DIAGNOSIS — G5781 Other specified mononeuropathies of right lower limb: Secondary | ICD-10-CM

## 2015-10-24 DIAGNOSIS — G5761 Lesion of plantar nerve, right lower limb: Secondary | ICD-10-CM | POA: Diagnosis not present

## 2015-10-24 NOTE — Progress Notes (Signed)
She presents today for follow-up of neuroma to the second interdigital space of the right foot. She states that she still has a sharp shooting pain and I just THINK IS GOING TO GET WELL. SHE RELATES THAT AFTER THE LAST KENALOG INJECTION IT WAS GOOD FOR ABOUT 2 WEEKS AND THEN RECURRED.  OBJECTIVE: VITAL SIGNS ARE STABLE ALERT AND ORIENTED 3. PULSES ARE PALPABLE. SHE HAS A PALPABLE MULDER'S CLICK second interdigital space of the right foot.  Assessment neuroma second interspace right foot.  Plan: We initiated alcohol injections today secondary interdigital space right foot. Follow up with her in 3 weeks

## 2015-11-14 ENCOUNTER — Encounter: Payer: Self-pay | Admitting: Podiatry

## 2015-11-14 ENCOUNTER — Ambulatory Visit (INDEPENDENT_AMBULATORY_CARE_PROVIDER_SITE_OTHER): Payer: Medicare Other | Admitting: Podiatry

## 2015-11-14 DIAGNOSIS — G5761 Lesion of plantar nerve, right lower limb: Secondary | ICD-10-CM | POA: Diagnosis not present

## 2015-11-14 DIAGNOSIS — D3610 Benign neoplasm of peripheral nerves and autonomic nervous system, unspecified: Secondary | ICD-10-CM

## 2015-11-14 NOTE — Progress Notes (Signed)
She presents today for follow-up neuroma third interdigital space of the right foot. She states this really does not getting any better.  Objective: Vital signs are stable she is alert and oriented 3. The patient locates the pain from the plantar aspect of the foot the area was marked and injected with Kenalog and local anesthetic. This was injected plantarly.  Assessment: Neuroma third interdigital space right foot.  Plan: Injected Kenalog today follow up with her in 3 weeks. Consider MRI at that point.

## 2015-12-05 ENCOUNTER — Other Ambulatory Visit: Payer: Self-pay | Admitting: Internal Medicine

## 2015-12-05 ENCOUNTER — Ambulatory Visit (INDEPENDENT_AMBULATORY_CARE_PROVIDER_SITE_OTHER): Payer: Medicare Other | Admitting: Podiatry

## 2015-12-05 ENCOUNTER — Encounter: Payer: Self-pay | Admitting: Podiatry

## 2015-12-05 VITALS — BP 150/87 | HR 76 | Resp 18

## 2015-12-05 DIAGNOSIS — Z1231 Encounter for screening mammogram for malignant neoplasm of breast: Secondary | ICD-10-CM

## 2015-12-05 DIAGNOSIS — D3610 Benign neoplasm of peripheral nerves and autonomic nervous system, unspecified: Secondary | ICD-10-CM

## 2015-12-05 DIAGNOSIS — G5761 Lesion of plantar nerve, right lower limb: Secondary | ICD-10-CM

## 2015-12-05 NOTE — Progress Notes (Signed)
She presents today for follow-up of a neuroma third interdigital space right foot. She was injected from the plantar surface last visit with Kenalog and she states this seems to be doing better.  Objective: Vital signs are stable alert and oriented 3. Pulses are strongly palpable. She still has pain on palpation third interdigital space right foot dorsal and plantar.  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  Plan: I injected this once again today with a small amount of Kenalog and dexamethasone for the plantar surface to alleviate her symptoms. Follow-up with her in 3 weeks

## 2015-12-12 ENCOUNTER — Ambulatory Visit
Admission: RE | Admit: 2015-12-12 | Discharge: 2015-12-12 | Disposition: A | Discharge status: Home / Self Care | Payer: Medicare Other | Source: Ambulatory Visit | Attending: Internal Medicine | Admitting: Internal Medicine

## 2015-12-12 ENCOUNTER — Other Ambulatory Visit: Payer: Self-pay | Admitting: Internal Medicine

## 2015-12-12 DIAGNOSIS — Z1231 Encounter for screening mammogram for malignant neoplasm of breast: Secondary | ICD-10-CM

## 2015-12-12 HISTORY — DX: Malignant neoplasm of unspecified site of unspecified female breast: C50.919

## 2015-12-19 ENCOUNTER — Other Ambulatory Visit: Payer: Self-pay | Admitting: Internal Medicine

## 2015-12-19 DIAGNOSIS — R920 Mammographic microcalcification found on diagnostic imaging of breast: Secondary | ICD-10-CM

## 2015-12-26 ENCOUNTER — Ambulatory Visit (INDEPENDENT_AMBULATORY_CARE_PROVIDER_SITE_OTHER): Payer: Medicare Other | Admitting: Podiatry

## 2015-12-26 DIAGNOSIS — D3610 Benign neoplasm of peripheral nerves and autonomic nervous system, unspecified: Secondary | ICD-10-CM | POA: Diagnosis not present

## 2015-12-26 DIAGNOSIS — M79673 Pain in unspecified foot: Secondary | ICD-10-CM

## 2015-12-26 NOTE — Progress Notes (Signed)
She presents today for follow-up chief complaint of pain to the forefoot right foot. She states this is stabbing radiating pain. All other conservative therapies have failed to alleviate her symptoms from steroid injections to dehydrated alcohol injections. However I do feel that this is either a neuroma from her previous surgery to the third interdigital space or a new neuroma to the second interdigital space of the right foot.  Objective: Pain on palpation second third metatarsophalangeal spaces.   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  Plan: Requesting an MRI for visualization and surgical consideration.

## 2015-12-28 ENCOUNTER — Ambulatory Visit
Admission: RE | Admit: 2015-12-28 | Discharge: 2015-12-28 | Disposition: A | Discharge status: Home / Self Care | Payer: Medicare Other | Source: Ambulatory Visit | Attending: Internal Medicine | Admitting: Internal Medicine

## 2015-12-28 DIAGNOSIS — R920 Mammographic microcalcification found on diagnostic imaging of breast: Secondary | ICD-10-CM | POA: Diagnosis present

## 2015-12-28 DIAGNOSIS — R921 Mammographic calcification found on diagnostic imaging of breast: Secondary | ICD-10-CM | POA: Insufficient documentation

## 2016-01-01 ENCOUNTER — Other Ambulatory Visit: Payer: Self-pay | Admitting: Internal Medicine

## 2016-01-01 DIAGNOSIS — R921 Mammographic calcification found on diagnostic imaging of breast: Secondary | ICD-10-CM

## 2016-01-02 ENCOUNTER — Telehealth: Payer: Self-pay | Admitting: Podiatry

## 2016-01-02 ENCOUNTER — Telehealth: Payer: Self-pay | Admitting: *Deleted

## 2016-01-02 NOTE — Telephone Encounter (Signed)
Patient called said that dr. Milinda Pointer ordered an MRI for her last week and she has not heard from anyone about the date/time for this appt. Also, she said that he put on a splint on her toe to align and that has taken care of the pain. Patient doenst think MRI is necessary at this point but wants to talk to the nurse.

## 2016-01-02 NOTE — Telephone Encounter (Signed)
Informed pt the orders had been placed but not sent to the pre-cert department.  I informed pt her insurance company did not require prior authorization at this time and she could call for an appt.  Pt states the brace feels so good she will wait a week to see if she continues to improve, if not she will call.  I gave pt the central scheduling number for [facility]. Faxed orders to [facility] with insurance determination.

## 2016-01-02 NOTE — Telephone Encounter (Signed)
Orders to [facility] for pre-cert. 

## 2016-01-17 ENCOUNTER — Ambulatory Visit
Admission: RE | Admit: 2016-01-17 | Discharge: 2016-01-17 | Disposition: A | Discharge status: Home / Self Care | Payer: Medicare Other | Source: Ambulatory Visit | Attending: Internal Medicine | Admitting: Internal Medicine

## 2016-01-17 DIAGNOSIS — R921 Mammographic calcification found on diagnostic imaging of breast: Secondary | ICD-10-CM

## 2016-01-17 DIAGNOSIS — D242 Benign neoplasm of left breast: Secondary | ICD-10-CM | POA: Diagnosis not present

## 2016-01-18 HISTORY — PX: BREAST BIOPSY: SHX20

## 2016-01-21 LAB — SURGICAL PATHOLOGY

## 2016-01-25 ENCOUNTER — Ambulatory Visit
Admission: RE | Admit: 2016-01-25 | Discharge: 2016-01-25 | Disposition: A | Discharge status: Home / Self Care | Payer: Medicare Other | Source: Ambulatory Visit | Attending: Podiatry | Admitting: Podiatry

## 2016-01-25 DIAGNOSIS — D3610 Benign neoplasm of peripheral nerves and autonomic nervous system, unspecified: Secondary | ICD-10-CM | POA: Diagnosis not present

## 2016-01-25 DIAGNOSIS — M19071 Primary osteoarthritis, right ankle and foot: Secondary | ICD-10-CM | POA: Insufficient documentation

## 2016-01-25 DIAGNOSIS — R609 Edema, unspecified: Secondary | ICD-10-CM | POA: Diagnosis not present

## 2016-02-04 ENCOUNTER — Telehealth: Payer: Self-pay | Admitting: Podiatry

## 2016-02-04 ENCOUNTER — Telehealth: Payer: Self-pay | Admitting: *Deleted

## 2016-02-04 NOTE — Telephone Encounter (Deleted)
-----   Message from Garrel Ridgel, [place] sent at 02/03/2016  8:45 PM EDT ----- Send for an over read and inform the patient of the delay.

## 2016-02-04 NOTE — Telephone Encounter (Signed)
Pt returned missed call and I read your note to the pt about the over read of her mri. Pt is scheduled to see Dr Milinda Pointer on 7.31.17 to discuss results

## 2016-02-04 NOTE — Telephone Encounter (Addendum)
-----   Message from Garrel Ridgel, [place] sent at 02/03/2016  8:45 PM EDT ----- Send for an over read and inform the patient of the delay. 02/04/2016-Unable to leave message on cell voicemail not set up. Unable to leave a voicemail message explaining the delay due to overread, home phone rang over 1 minute without answering service. Faxed request for copy of MRI disc. 02/08/2016-Mailed copy of MRI disc to [facility].

## 2016-02-18 ENCOUNTER — Ambulatory Visit: Payer: Medicare Other | Admitting: Podiatry

## 2016-02-20 ENCOUNTER — Ambulatory Visit (INDEPENDENT_AMBULATORY_CARE_PROVIDER_SITE_OTHER): Payer: Medicare Other | Admitting: Podiatry

## 2016-02-20 ENCOUNTER — Encounter: Payer: Self-pay | Admitting: Podiatry

## 2016-02-20 DIAGNOSIS — D3610 Benign neoplasm of peripheral nerves and autonomic nervous system, unspecified: Secondary | ICD-10-CM | POA: Diagnosis not present

## 2016-02-20 MED ORDER — TRAMADOL HCL 50 MG PO TABS: 50.0000 mg | ORAL_TABLET | Freq: Three times a day (TID) | ORAL | 3 refills | Status: DC | PRN

## 2016-02-20 NOTE — Progress Notes (Signed)
She states today that she is having more right hip pain and lower back pain and that the neuroma has not changed. She is here for his her MRI review.  Objective: Vital signs are stable she is alert and oriented 3. Pulses are palpable. Neurologic sensorium still significant for neuroma third interdigital space right foot even after resection. MRI states that there is a small neuroma present.  Assessment: Neuroma third interdigital space right foot even after resection. Possibly associated with some sciatic or lower back issues from previous surgeries.  Plan: She'll follow up with [place] on an as-needed basis I did write her prescription for tramadol.

## 2016-03-05 ENCOUNTER — Encounter: Payer: Self-pay | Admitting: *Deleted

## 2016-12-03 ENCOUNTER — Other Ambulatory Visit: Payer: Self-pay | Admitting: Internal Medicine

## 2016-12-03 DIAGNOSIS — Z1231 Encounter for screening mammogram for malignant neoplasm of breast: Secondary | ICD-10-CM

## 2016-12-12 ENCOUNTER — Ambulatory Visit
Admission: RE | Admit: 2016-12-12 | Discharge: 2016-12-12 | Disposition: A | Discharge status: Home / Self Care | Payer: Medicare Other | Source: Ambulatory Visit | Attending: Internal Medicine | Admitting: Internal Medicine

## 2016-12-12 DIAGNOSIS — Z1231 Encounter for screening mammogram for malignant neoplasm of breast: Secondary | ICD-10-CM

## 2017-09-28 ENCOUNTER — Encounter: Payer: Self-pay | Admitting: Urology

## 2017-09-28 ENCOUNTER — Ambulatory Visit (INDEPENDENT_AMBULATORY_CARE_PROVIDER_SITE_OTHER): Payer: Medicare Other | Admitting: Urology

## 2017-09-28 VITALS — BP 139/86 | HR 70 | Ht 65.0 in | Wt 150.0 lb

## 2017-09-28 DIAGNOSIS — R310 Gross hematuria: Secondary | ICD-10-CM | POA: Diagnosis not present

## 2017-09-28 LAB — URINALYSIS, COMPLETE
BILIRUBIN UA: NEGATIVE
Glucose, UA: NEGATIVE
Ketones, UA: NEGATIVE
Nitrite, UA: NEGATIVE
PH UA: 6 (ref 5.0–7.5)
Protein, UA: NEGATIVE
Specific Gravity, UA: 1.02 (ref 1.005–1.030)
UUROB: 0.2 mg/dL (ref 0.2–1.0)

## 2017-09-28 LAB — MICROSCOPIC EXAMINATION: RBC, UA: NONE SEEN /hpf (ref 0–?)

## 2017-09-28 NOTE — Progress Notes (Signed)
09/28/2017 2:11 PM   Adriana Moreno 08/23/1933 [id number]  Referring provider: Madelyn Brunner, MD No address on file  Chief Complaint  Patient presents with  . Other    HPI: 84 year old female presents for follow-up of recurrent UTIs.  I last saw her at [facility] on 08/11/2016.  She has a long history of recurrent lower tract infections and has been on antibiotic suppression.  Her symptoms include frequency, urgency and significant odor to her urine.  She has been on anticholinergic medication however could not tolerate secondary to dry mouth.  She denies fever, chills, gross hematuria or flank/abdominal pain.   PMH: Past Medical History:  Diagnosis Date  . Breast cancer ([place])    left breast cancer  . Cancer [facility]) lefbreast   2013    Surgical History: Past Surgical History:  Procedure Laterality Date  . BREAST BIOPSY Left 2003   positive, took radiation  . BREAST BIOPSY Right 2003   neg  . BREAST BIOPSY Left 01/18/2016   stereo bx. path pending    Home Medications:  Allergies as of 09/28/2017      Reactions   Bee Venom Anaphylaxis   Erythromycin Ethylsuccinate Nausea And Vomiting   Codeine Rash   Other Reaction: Not Assessed Other Reaction: Not Assessed Other reaction(s): UNKNOWN      Medication List        Accurate as of 09/28/17  2:11 PM. Always use your most recent med list.          aspirin 81 MG tablet Take 81 mg by mouth daily.   B-12 PO Take by mouth.   CALCIUM + D PO Take by mouth.   celecoxib 200 MG capsule Commonly known as:  CELEBREX Take 200 mg by mouth 2 (two) times daily.   CYSTEX PO Take by mouth.   FLUTICASONE PROPIONATE (NASAL) NA Place into the nose.   fluticasone 50 MCG/ACT nasal spray Commonly known as:  FLONASE   indapamide 2.5 MG tablet Commonly known as:  LOZOL Take 2.5 mg by mouth daily.   LYSINE PO Take by mouth.   nitrofurantoin 50 MG capsule Commonly known as:  MACRODANTIN   omeprazole 20 MG  capsule Commonly known as:  PRILOSEC Take 20 mg by mouth daily.   traMADol 50 MG tablet Commonly known as:  ULTRAM Take 1 tablet (50 mg total) by mouth every 8 (eight) hours as needed.   zolpidem 10 MG tablet Commonly known as:  AMBIEN Take 10 mg by mouth at bedtime as needed for sleep.       Allergies:  Allergies  Allergen Reactions  . Bee Venom Anaphylaxis  . Erythromycin Ethylsuccinate Nausea And Vomiting  . Codeine Rash    Other Reaction: Not Assessed Other Reaction: Not Assessed Other reaction(s): UNKNOWN    Family History: No family history on file.  Social History:  reports that  has never smoked. she has never used smokeless tobacco. She reports that she drinks alcohol. She reports that she does not use drugs.  ROS: UROLOGY Frequent Urination?: No Hard to postpone urination?: Yes Burning/pain with urination?: Yes Get up at night to urinate?: No Leakage of urine?: Yes Urine stream starts and stops?: No Trouble starting stream?: No Do you have to strain to urinate?: No Blood in urine?: No Urinary tract infection?: No Sexually transmitted disease?: No Injury to kidneys or bladder?: No Weak stream?: No Currently pregnant?: No Vaginal bleeding?: No Last menstrual period?: n  Gastrointestinal Nausea?: No Vomiting?: No  Indigestion/heartburn?: No Diarrhea?: No Constipation?: No  Constitutional Fever: No Night sweats?: No Weight loss?: No Fatigue?: No  Skin Skin rash/lesions?: No Itching?: No  Eyes Blurred vision?: No Double vision?: No  Ears/Nose/Throat Sore throat?: No Sinus problems?: No  Hematologic/Lymphatic Swollen glands?: No Easy bruising?: No  Cardiovascular Leg swelling?: No Chest pain?: No  Respiratory Cough?: No Shortness of breath?: No  Endocrine Excessive thirst?: No  Musculoskeletal Back pain?: No Joint pain?: No  Neurological Headaches?: No Dizziness?: No  Psychologic Depression?: No Anxiety?:  No  Physical Exam: BP 139/86   Pulse 70   Ht 5\' 5"  (1.651 m)   Wt 150 lb (68 kg)   BMI 24.96 kg/m    Constitutional:  Alert and oriented, No acute distress. HEENT: [place] AT, moist mucus membranes.  Trachea midline, no masses. Cardiovascular: No clubbing, cyanosis, or edema. Respiratory: Normal respiratory effort, no increased work of breathing. GI: Abdomen is soft, nontender, nondistended, no abdominal masses GU: No CVA tenderness Lymph: No cervical or inguinal lymphadenopathy. Skin: No rashes, bruises or suspicious lesions. Neurologic: Grossly intact, no focal deficits, moving all 4 extremities. Psychiatric: Normal mood and affect.  Laboratory Data: Lab Results  Component Value Date   WBC 8.2 06/15/2012   HGB 13.2 06/15/2012   HCT 38.4 06/15/2012   MCV 92 06/15/2012   PLT 176 06/15/2012    Lab Results  Component Value Date   CREATININE 0.74 06/15/2012    Assessment & Plan:   Urinalysis today does show significant pyuria and bacteriuria.  She is symptomatic.  A urine culture was ordered and will await results prior to treatment.  She requested a refill of her suppressive antibiotic however advised waiting until her culture results return.  She was given samples of Myrbetriq for her overactive bladder symptoms and an Rx was also sent to her pharmacy.  Continue annual follow-up.   [name], [place] [address] [place], [place] [address] [phone]

## 2017-10-02 ENCOUNTER — Telehealth: Payer: Self-pay

## 2017-10-02 NOTE — Telephone Encounter (Signed)
Pt called stating she was under the impression she was going to be restarted on maintenance abx. Please advise.

## 2017-10-02 NOTE — Telephone Encounter (Signed)
She had pyuria and it was not going to start her on a maintenance antibiotic until she was treated for her infection and we got culture results.  It does not look like her urine culture got ordered so unfortunately we will need to get another urine for culture.

## 2017-10-04 ENCOUNTER — Encounter: Payer: Self-pay | Admitting: Urology

## 2017-10-04 MED ORDER — MIRABEGRON ER 25 MG PO TB24: 25.0000 mg | ORAL_TABLET | Freq: Every day | ORAL | 5 refills | Status: DC

## 2017-10-08 NOTE — Telephone Encounter (Signed)
Patient is unable to come bye this week to do another UA She said she will call back Monday and try and come in   [place]

## 2017-10-08 NOTE — Telephone Encounter (Signed)
LMOM

## 2017-10-12 ENCOUNTER — Other Ambulatory Visit: Payer: Medicare Other

## 2017-10-12 DIAGNOSIS — R310 Gross hematuria: Secondary | ICD-10-CM

## 2017-10-12 LAB — URINALYSIS, COMPLETE
BILIRUBIN UA: NEGATIVE
GLUCOSE, UA: NEGATIVE
KETONES UA: NEGATIVE
Nitrite, UA: NEGATIVE
PROTEIN UA: NEGATIVE
SPEC GRAV UA: 1.02 (ref 1.005–1.030)
Urobilinogen, Ur: 0.2 mg/dL (ref 0.2–1.0)
pH, UA: 7 (ref 5.0–7.5)

## 2017-10-12 LAB — MICROSCOPIC EXAMINATION

## 2017-10-15 LAB — CULTURE, URINE COMPREHENSIVE

## 2017-10-16 ENCOUNTER — Other Ambulatory Visit: Payer: Self-pay | Admitting: Urology

## 2017-10-16 ENCOUNTER — Telehealth: Payer: Self-pay

## 2017-10-16 MED ORDER — CEPHALEXIN 500 MG PO CAPS: 500.0000 mg | ORAL_CAPSULE | Freq: Every day | ORAL | 3 refills | Status: DC

## 2017-10-16 MED ORDER — CEFUROXIME AXETIL 250 MG PO TABS: 500.0000 mg | ORAL_TABLET | Freq: Two times a day (BID) | ORAL | 0 refills | Status: DC

## 2017-10-16 NOTE — Telephone Encounter (Signed)
Pt notified on vmail, request patient call back for directions on abx, scripts sent

## 2017-10-16 NOTE — Telephone Encounter (Signed)
Patient notified on how to take the ABX. Patient voiced understanding.

## 2017-10-16 NOTE — Telephone Encounter (Signed)
-----   Message from [name], MD sent at 10/16/2017  7:39 AM EDT ----- Urine culture positive for infection.  Please send in prescriptions for cefuroxime 500 mg twice daily times 7 days and after she completes this course to start suppressive therapy with Keflex 500 mg daily.  Thanks

## 2017-10-19 IMAGING — MG MM DIGITAL SCREENING BILAT W/ TOMO W/ CAD
9 of 12 series · 9 of 28 positions shown · non-contrast
Comparison: Previous exam(s).

CLINICAL DATA: Screening. History of left breast cancer in 9663
status post breast conservation therapy.

Additional history of benign right breast excisional biopsy in 9663.
EXAM:
2D DIGITAL SCREENING BILATERAL MAMMOGRAM WITH CAD AND ADJUNCT TOMO

[R MLO synth-2D]
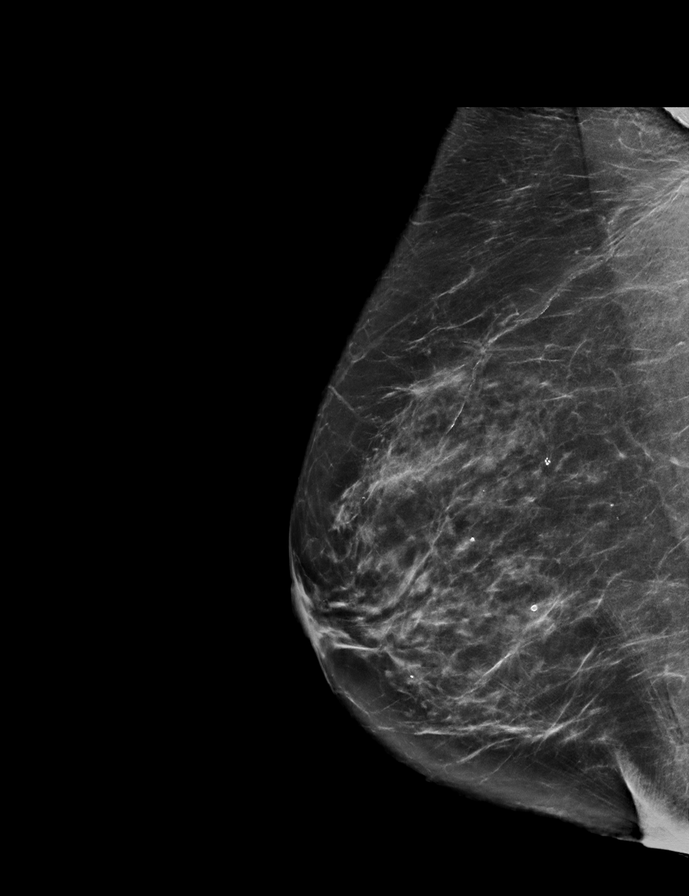

[L MLO synth-2D]
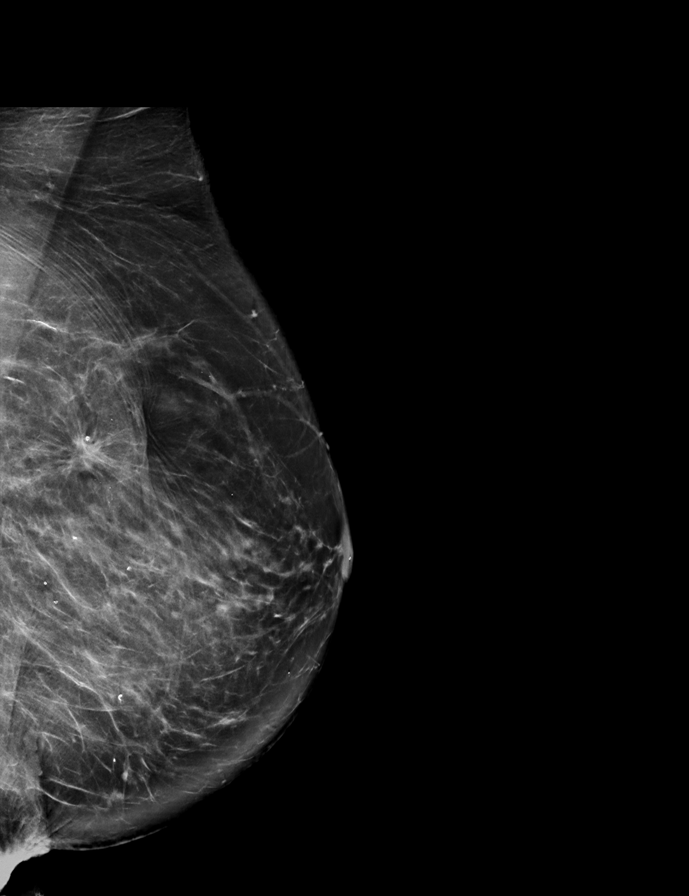

[L MLO]
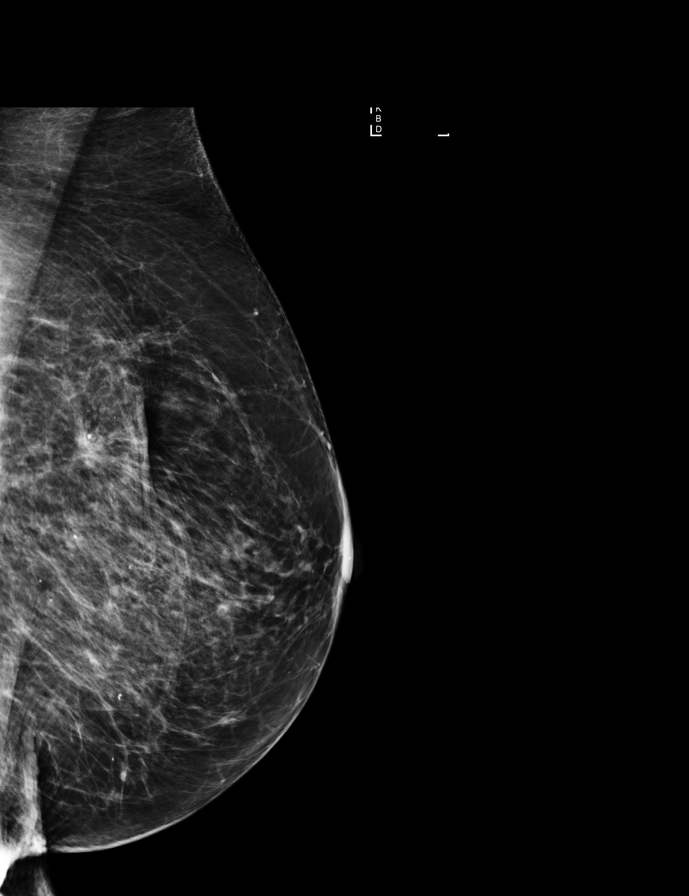

[R CC]
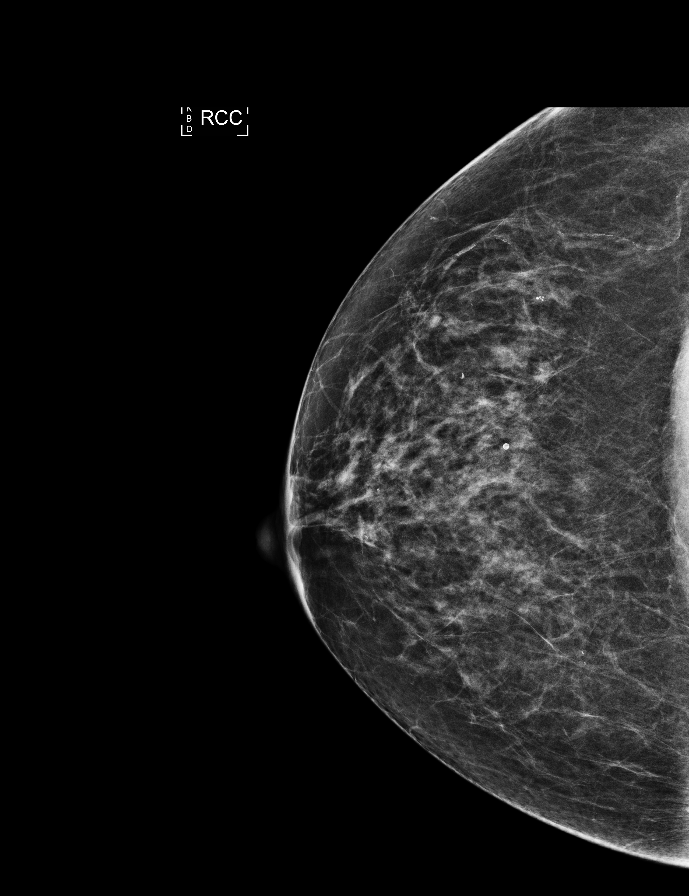

[L CC synth-2D]
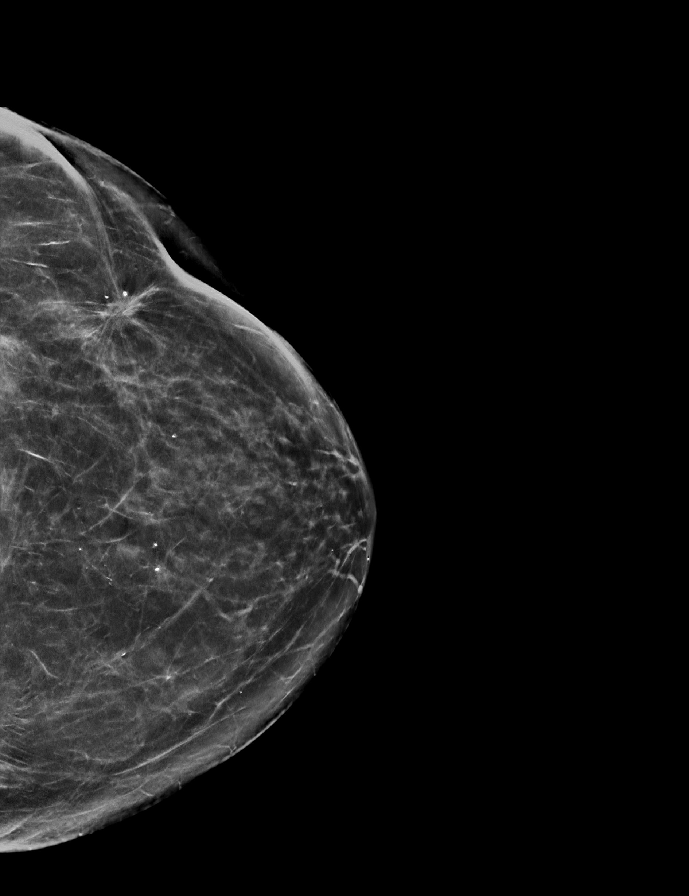

[R CC synth-2D]
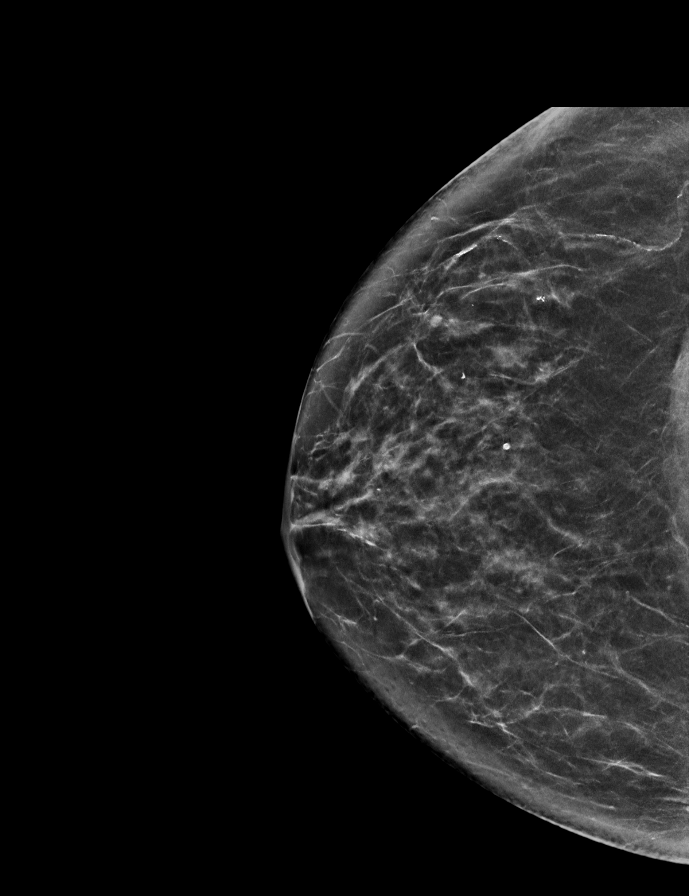

[R MLO]
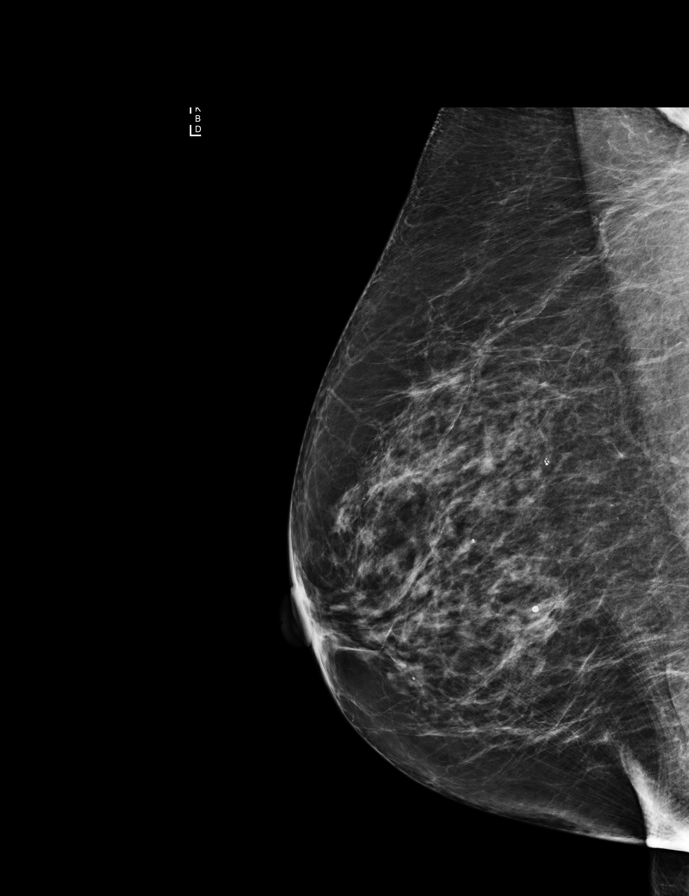

[L CC]
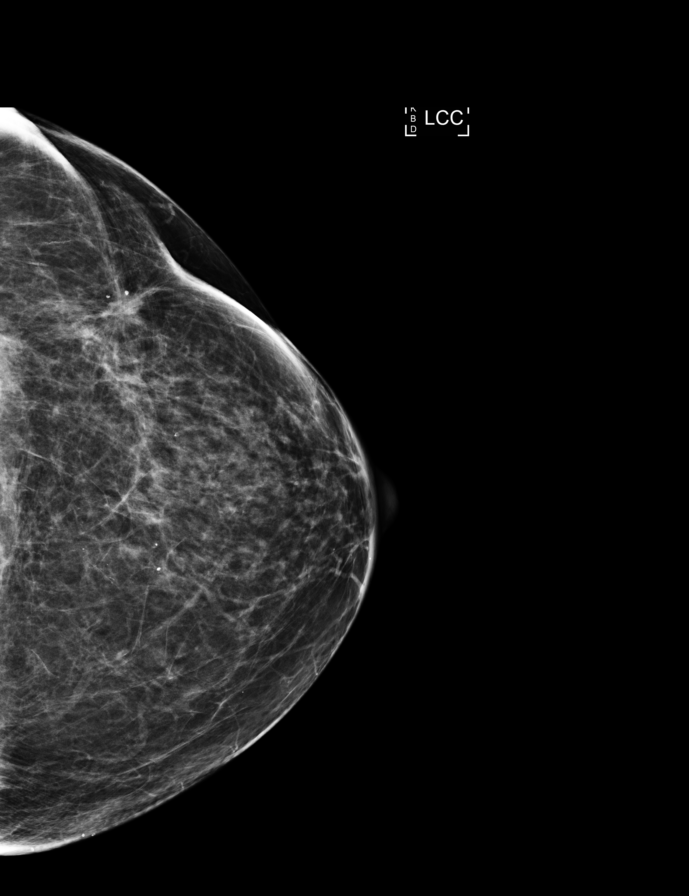

[L CC tomo · tomo slice 38/75.0]
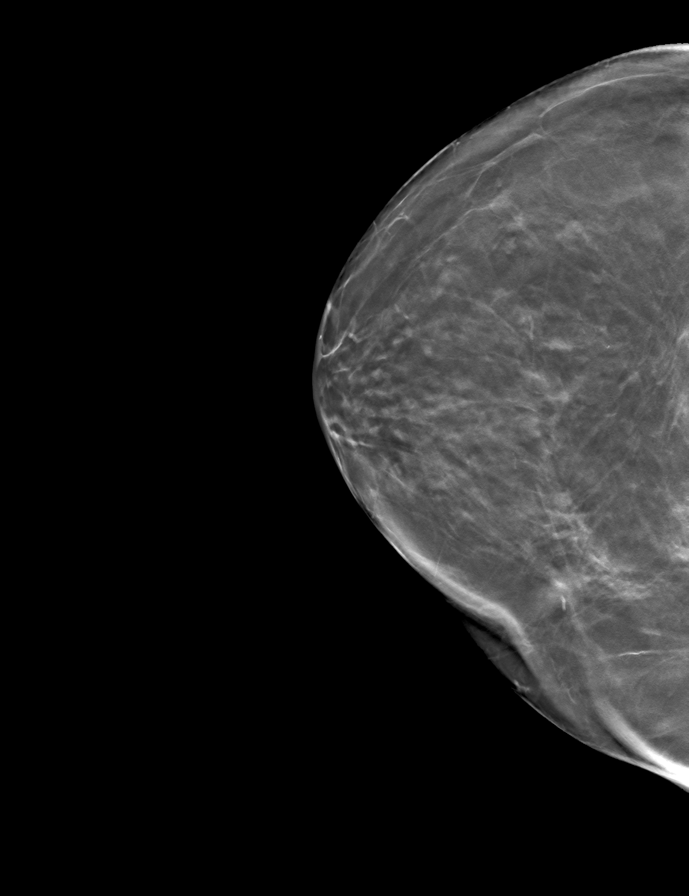

[9 of 28 positions shown; findings below may reference images not displayed]

ACR Breast Density Category b: There are scattered areas of
fibroglandular density.
FINDINGS: In the left breast, calcifications warrant further evaluation. These
calcifications are seen within the upper, slightly inner, left
breast, tomosynthesis CC slice 35 and MLO slice 46.

In the right breast, no findings suspicious for malignancy. There
are stable postsurgical changes within the right breast. Images were
processed with CAD.
IMPRESSION: Further evaluation is suggested for calcifications in the left
breast.

RECOMMENDATION:
Diagnostic mammogram of the left breast. (Code:[id number])

The patient will be contacted regarding the findings, and additional
imaging will be scheduled.

BI-RADS CATEGORY  0: Incomplete. Need additional imaging evaluation
and/or prior mammograms for comparison.

## 2017-11-02 ENCOUNTER — Other Ambulatory Visit: Payer: Self-pay | Admitting: Internal Medicine

## 2017-11-02 DIAGNOSIS — Z1231 Encounter for screening mammogram for malignant neoplasm of breast: Secondary | ICD-10-CM

## 2017-12-09 ENCOUNTER — Other Ambulatory Visit: Payer: Self-pay | Admitting: Podiatry

## 2017-12-09 ENCOUNTER — Ambulatory Visit (INDEPENDENT_AMBULATORY_CARE_PROVIDER_SITE_OTHER): Payer: Medicare Other

## 2017-12-09 ENCOUNTER — Encounter: Payer: Self-pay | Admitting: Podiatry

## 2017-12-09 ENCOUNTER — Ambulatory Visit (INDEPENDENT_AMBULATORY_CARE_PROVIDER_SITE_OTHER): Payer: Medicare Other | Admitting: Podiatry

## 2017-12-09 DIAGNOSIS — G5781 Other specified mononeuropathies of right lower limb: Secondary | ICD-10-CM

## 2017-12-09 DIAGNOSIS — D3610 Benign neoplasm of peripheral nerves and autonomic nervous system, unspecified: Secondary | ICD-10-CM

## 2017-12-09 DIAGNOSIS — M779 Enthesopathy, unspecified: Secondary | ICD-10-CM | POA: Diagnosis not present

## 2017-12-09 DIAGNOSIS — G5762 Lesion of plantar nerve, left lower limb: Secondary | ICD-10-CM

## 2017-12-09 DIAGNOSIS — G5761 Lesion of plantar nerve, right lower limb: Secondary | ICD-10-CM | POA: Diagnosis not present

## 2017-12-09 DIAGNOSIS — M778 Other enthesopathies, not elsewhere classified: Secondary | ICD-10-CM

## 2017-12-09 DIAGNOSIS — S92301A Fracture of unspecified metatarsal bone(s), right foot, initial encounter for closed fracture: Secondary | ICD-10-CM

## 2017-12-09 NOTE — Progress Notes (Signed)
She presents today stating that she feels like she may have broken her foot again she states that she feels a pins-and-needles at night and pressure when she standing on the foot.  She states that she takes Celebrex and tramadol and it does help to some degree.  Objective: Vital signs are stable she is alert and oriented x3 pulses are palpable.  She has palpable Mulder click to the third interdigital space of the right foot and to the second interdigital space of the left foot.  Assessment more than likely she has neuromas to the third interspace right second interdigital space left.  Plan: At this point I injected 10 mg of Kenalog and 5 mg Marcaine to the second and third interdigital spaces respectively after sterile Betadine skin prep.  Tolerated procedure well without complications I will up with her in 1 month.

## 2018-01-05 ENCOUNTER — Ambulatory Visit
Admission: RE | Admit: 2018-01-05 | Discharge: 2018-01-05 | Disposition: A | Discharge status: Home / Self Care | Payer: Medicare Other | Source: Ambulatory Visit | Attending: Internal Medicine | Admitting: Internal Medicine

## 2018-01-05 DIAGNOSIS — Z1231 Encounter for screening mammogram for malignant neoplasm of breast: Secondary | ICD-10-CM | POA: Diagnosis present

## 2018-01-06 ENCOUNTER — Encounter: Payer: Self-pay | Admitting: Podiatry

## 2018-01-06 ENCOUNTER — Ambulatory Visit (INDEPENDENT_AMBULATORY_CARE_PROVIDER_SITE_OTHER): Payer: Medicare Other | Admitting: Podiatry

## 2018-01-06 DIAGNOSIS — G5781 Other specified mononeuropathies of right lower limb: Secondary | ICD-10-CM

## 2018-01-06 DIAGNOSIS — G5761 Lesion of plantar nerve, right lower limb: Secondary | ICD-10-CM | POA: Diagnosis not present

## 2018-01-06 DIAGNOSIS — G5762 Lesion of plantar nerve, left lower limb: Secondary | ICD-10-CM | POA: Diagnosis not present

## 2018-01-06 NOTE — Progress Notes (Signed)
She presents today for follow-up of neuroma third interdigital space bilaterally.  Objective: Vital signs are stable she is alert oriented x3 pulses remain palpable palpable Mulder's click third interspace bilateral.  Assessment: Neuroma third interspace lateral.  Plan: After sterile Betadine skin prep injected 2 cc of 4% dehydrated alcohol from dorsal distal to plantar proximal third interdigital space bilateral.  Tolerated procedure well

## 2018-02-10 ENCOUNTER — Ambulatory Visit: Payer: Medicare Other | Admitting: Podiatry

## 2018-02-17 ENCOUNTER — Ambulatory Visit: Payer: Medicare Other | Admitting: Podiatry

## 2018-02-22 ENCOUNTER — Ambulatory Visit: Payer: Medicare Other | Admitting: Podiatry

## 2018-04-29 ENCOUNTER — Telehealth: Payer: Self-pay | Admitting: Urology

## 2018-04-29 MED ORDER — MIRABEGRON ER 25 MG PO TB24: 25.0000 mg | ORAL_TABLET | Freq: Every day | ORAL | 6 refills | Status: DC

## 2018-04-29 NOTE — Telephone Encounter (Signed)
Pt is out of Myrbetriq 25 mg.  Pharmacy ([facility] in [place]) said they have sent over 2 requests.  Please let patient know.

## 2018-06-25 DIAGNOSIS — F411 Generalized anxiety disorder: Secondary | ICD-10-CM | POA: Insufficient documentation

## 2018-08-03 ENCOUNTER — Telehealth: Payer: Self-pay | Admitting: Urology

## 2018-08-03 ENCOUNTER — Other Ambulatory Visit: Payer: Self-pay

## 2018-08-03 MED ORDER — MIRABEGRON ER 25 MG PO TB24: 25.0000 mg | ORAL_TABLET | Freq: Every day | ORAL | 1 refills | Status: DC

## 2018-08-03 NOTE — Telephone Encounter (Signed)
Pt. Expressed her Rx for Myrbetriq has done well for her and that it is to expensive at her pharmacy and request that this Rx be sent to [facility] with a 90 day supply due to the cost.

## 2018-08-03 NOTE — Telephone Encounter (Signed)
Per pt request, Rx for Myrbetriq 90 day supply sent to [facility]

## 2018-10-20 DIAGNOSIS — M159 Polyosteoarthritis, unspecified: Secondary | ICD-10-CM | POA: Insufficient documentation

## 2018-12-06 ENCOUNTER — Other Ambulatory Visit: Payer: Self-pay | Admitting: Internal Medicine

## 2018-12-06 DIAGNOSIS — Z1231 Encounter for screening mammogram for malignant neoplasm of breast: Secondary | ICD-10-CM

## 2019-01-11 ENCOUNTER — Ambulatory Visit: Payer: Medicare Other

## 2019-01-11 ENCOUNTER — Other Ambulatory Visit: Payer: Self-pay

## 2019-01-11 ENCOUNTER — Ambulatory Visit
Admission: RE | Admit: 2019-01-11 | Discharge: 2019-01-11 | Disposition: A | Discharge status: Home / Self Care | Payer: Medicare Other | Source: Ambulatory Visit | Attending: Internal Medicine | Admitting: Internal Medicine

## 2019-01-11 DIAGNOSIS — Z1231 Encounter for screening mammogram for malignant neoplasm of breast: Secondary | ICD-10-CM | POA: Insufficient documentation

## 2019-02-22 ENCOUNTER — Other Ambulatory Visit: Payer: Self-pay | Admitting: Internal Medicine

## 2019-02-22 ENCOUNTER — Other Ambulatory Visit (HOSPITAL_COMMUNITY): Payer: Self-pay | Admitting: Internal Medicine

## 2019-02-22 DIAGNOSIS — R410 Disorientation, unspecified: Secondary | ICD-10-CM

## 2019-02-24 ENCOUNTER — Ambulatory Visit: Payer: Medicare Other

## 2019-03-02 ENCOUNTER — Other Ambulatory Visit: Payer: Self-pay | Admitting: Family Medicine

## 2019-03-02 MED ORDER — MIRABEGRON ER 25 MG PO TB24: 25.0000 mg | ORAL_TABLET | Freq: Every day | ORAL | 1 refills | Status: DC

## 2019-04-25 ENCOUNTER — Encounter: Payer: Self-pay | Admitting: Urology

## 2019-04-25 ENCOUNTER — Other Ambulatory Visit: Payer: Self-pay

## 2019-04-25 ENCOUNTER — Ambulatory Visit (INDEPENDENT_AMBULATORY_CARE_PROVIDER_SITE_OTHER): Payer: Medicare Other | Admitting: Urology

## 2019-04-25 VITALS — BP 151/82 | HR 74 | Ht 66.0 in | Wt 145.7 lb

## 2019-04-25 DIAGNOSIS — R8271 Bacteriuria: Secondary | ICD-10-CM

## 2019-04-25 DIAGNOSIS — R399 Unspecified symptoms and signs involving the genitourinary system: Secondary | ICD-10-CM

## 2019-04-25 DIAGNOSIS — N3281 Overactive bladder: Secondary | ICD-10-CM

## 2019-04-25 LAB — BLADDER SCAN AMB NON-IMAGING

## 2019-04-25 NOTE — Progress Notes (Signed)
04/25/2019 10:36 AM   Adriana Moreno 09-26-1933 [id number]  Referring provider: Baxter Hire, MD [place],  [place] [address]  Chief Complaint  Patient presents with  . Recurrent UTI    HPI: 85 y.o. female followed for a history of recurrent UTIs and overactive bladder.  She was last seen March 2019 and was on suppressive antibiotic therapy at that time.  She was also given Myrbetriq samples for overactive bladder which she has continued.  She is no longer on antibiotic suppression.  Since August 2020 she has had 5+ urine cultures to growing E. coli and 3 growing Klebsiella aerogenes.  She states however she does not have her typical UTI symptoms of frequency, urgency, dysuria and bladder pain.  She has no bothersome voiding symptoms and states she would not have known she had an "infection" if she had not been told her urinalysis was abnormal.   PMH: Past Medical History:  Diagnosis Date  . Breast cancer ([facility])    left breast cancer  . Cancer ([facility]) lefbreast   2013  . Recurrent UTI     Surgical History: Past Surgical History:  Procedure Laterality Date  . BREAST BIOPSY Left 2003   positive, took radiation  . BREAST BIOPSY Left 01/18/2016   stereo bx./ neg  . BREAST EXCISIONAL BIOPSY Right 2003   neg  . BREAST LUMPECTOMY Left 2003   breast ca    Home Medications:  Allergies as of 04/25/2019      Reactions   Bee Venom Anaphylaxis   Erythromycin Ethylsuccinate Nausea And Vomiting   Codeine Rash   Other Reaction: Not Assessed Other Reaction: Not Assessed Other reaction(s): UNKNOWN      Medication List       Accurate as of April 25, 2019 10:36 AM. If you have any questions, ask your nurse or doctor.        STOP taking these medications   cephALEXin 500 MG capsule Commonly known as: Keflex Stopped by: [name], MD   fluticasone 50 MCG/ACT nasal spray Commonly known as: FLONASE Stopped by: [name], MD   FLUTICASONE  PROPIONATE (NASAL) NA Stopped by: [name], MD     TAKE these medications   aspirin 81 MG tablet Take 81 mg by mouth daily.   B-12 PO Take by mouth.   CALCIUM + D PO Take by mouth.   Calcium Carb-Cholecalciferol [phone] MG-UNIT Tabs Take by mouth.   celecoxib 200 MG capsule Commonly known as: CELEBREX Take 200 mg by mouth 2 (two) times daily.   CYSTEX PO Take by mouth.   indapamide 2.5 MG tablet Commonly known as: LOZOL Take 2.5 mg by mouth daily.   LYSINE PO Take by mouth.   Magnesium 250 MG Tabs Take by mouth.   mirabegron ER 25 MG Tb24 tablet Commonly known as: MYRBETRIQ Take 1 tablet (25 mg total) by mouth daily.   NEOMYCIN-POLYMYXIN-HYDROCORTISONE 1 % Soln OTIC solution Commonly known as: CORTISPORIN   omeprazole 20 MG capsule Commonly known as: PRILOSEC Take 20 mg by mouth daily.   ondansetron 4 MG disintegrating tablet Commonly known as: ZOFRAN-ODT   potassium chloride SA 20 MEQ tablet Commonly known as: KLOR-CON   traMADol 50 MG tablet Commonly known as: ULTRAM Take 1 tablet (50 mg total) by mouth every 8 (eight) hours as needed.   triamcinolone cream 0.1 % Commonly known as: KENALOG Apply topically.   zolpidem 10 MG tablet Commonly known as: AMBIEN Take  10 mg by mouth at bedtime as needed for sleep.       Allergies:  Allergies  Allergen Reactions  . Bee Venom Anaphylaxis  . Erythromycin Ethylsuccinate Nausea And Vomiting  . Codeine Rash    Other Reaction: Not Assessed Other Reaction: Not Assessed Other reaction(s): UNKNOWN    Family History: No family history on file.  Social History:  reports that she has never smoked. She has never used smokeless tobacco. She reports current alcohol use. She reports that she does not use drugs.  ROS: UROLOGY Frequent Urination?: No Hard to postpone urination?: No Burning/pain with urination?: No Get up at night to urinate?: No Leakage of urine?: No Urine stream starts and  stops?: No Trouble starting stream?: No Do you have to strain to urinate?: No Blood in urine?: Yes Urinary tract infection?: Yes Sexually transmitted disease?: No Injury to kidneys or bladder?: No Painful intercourse?: No Weak stream?: No Currently pregnant?: No Vaginal bleeding?: No Last menstrual period?: N/A  Gastrointestinal Nausea?: Yes Vomiting?: No Indigestion/heartburn?: No Diarrhea?: No Constipation?: No  Constitutional Fever: No Night sweats?: No Weight loss?: No Fatigue?: No  Skin Skin rash/lesions?: No Itching?: No  Eyes Blurred vision?: No Double vision?: No  Ears/Nose/Throat Sore throat?: No Sinus problems?: No  Hematologic/Lymphatic Swollen glands?: No Easy bruising?: No  Cardiovascular Leg swelling?: No Chest pain?: No  Respiratory Cough?: No Shortness of breath?: No  Endocrine Excessive thirst?: No  Musculoskeletal Back pain?: Yes Joint pain?: Yes  Neurological Headaches?: Yes Dizziness?: No  Psychologic Depression?: No Anxiety?: No  Physical Exam: BP (!) 151/82 (BP Location: Left Arm, Patient Position: Sitting, Cuff Size: Normal)   Pulse 74   Ht 5\' 6"  (1.676 m)   Wt 145 lb 11.2 oz (66.1 kg)   BMI 23.52 kg/m   Constitutional:  Alert and oriented, No acute distress. HEENT: [place] AT, moist mucus membranes.  Trachea midline, no masses. Cardiovascular: No clubbing, cyanosis, or edema. Respiratory: Normal respiratory effort, no increased work of breathing. GI: Abdomen is soft, nontender, nondistended, no abdominal masses GU: No CVA tenderness Lymph: No cervical or inguinal lymphadenopathy. Skin: No rashes, bruises or suspicious lesions. Neurologic: Grossly intact, no focal deficits, moving all 4 extremities. Psychiatric: Normal mood and affect.   Urinalysis Dipstick trace intact blood, 1+ leukocytes, nitrite negative Microscopy >30 WBC  Assessment & Plan:    - Asymptomatic bacteriuria PVR by bladder scan was 42 mL.   Would not recommend treatment of asymptomatic bacteriuria due to development of antibiotic resistance.  She has not had recent upper tract imaging and have recommended scheduling a renal ultrasound.  Prior cystoscopy was unremarkable.  - Overactive bladder Doing well on Myrbetriq.   [name], [facility] [address], [place], [place] [address] [phone]

## 2019-04-26 ENCOUNTER — Encounter: Payer: Self-pay | Admitting: Urology

## 2019-04-26 DIAGNOSIS — N3281 Overactive bladder: Secondary | ICD-10-CM | POA: Insufficient documentation

## 2019-04-26 LAB — MICROSCOPIC EXAMINATION: WBC, UA: >30 /hpf — AB (ref 0–5)

## 2019-04-26 LAB — URINALYSIS, COMPLETE
Bilirubin, UA: NEGATIVE
Glucose, UA: NEGATIVE
Ketones, UA: NEGATIVE
Nitrite, UA: NEGATIVE
Protein,UA: NEGATIVE
Specific Gravity, UA: 1.025 (ref 1.005–1.030)
Urobilinogen, Ur: 0.2 mg/dL (ref 0.2–1.0)
pH, UA: 6 (ref 5.0–7.5)

## 2019-04-27 LAB — CULTURE, URINE COMPREHENSIVE

## 2019-05-31 ENCOUNTER — Ambulatory Visit
Admission: RE | Admit: 2019-05-31 | Discharge: 2019-05-31 | Disposition: A | Discharge status: Home / Self Care | Payer: Medicare Other | Source: Ambulatory Visit | Attending: Urology | Admitting: Urology

## 2019-05-31 ENCOUNTER — Other Ambulatory Visit: Payer: Self-pay

## 2019-05-31 DIAGNOSIS — Z87440 Personal history of urinary (tract) infections: Secondary | ICD-10-CM | POA: Diagnosis not present

## 2019-05-31 DIAGNOSIS — R93422 Abnormal radiologic findings on diagnostic imaging of left kidney: Secondary | ICD-10-CM | POA: Insufficient documentation

## 2019-05-31 DIAGNOSIS — R8271 Bacteriuria: Secondary | ICD-10-CM | POA: Insufficient documentation

## 2019-06-03 ENCOUNTER — Telehealth: Payer: Self-pay | Admitting: Urology

## 2019-06-03 DIAGNOSIS — N2889 Other specified disorders of kidney and ureter: Secondary | ICD-10-CM

## 2019-06-03 NOTE — Telephone Encounter (Signed)
Called pt she states that she would like to proceed with additional imaging and will do whichever imaging you think is best. Advised pt that order would be placed and radiology would be in contact with her. Pt gave verbal understanding.

## 2019-06-03 NOTE — Telephone Encounter (Signed)
Patient had a RUS on 05-31-19 and is asking for her results   Adriana Moreno

## 2019-06-03 NOTE — Telephone Encounter (Signed)
Renal ultrasound showed no stones or blockage.  There was a abnormality of the renal contour which is most likely an anatomic variant.  The radiologist recommended a repeat renal ultrasound in 3 months versus an MRI.  A CT could also be performed.  To accurately exclude a mass I would recommend either a CT or MRI.  If she desires to proceed we will put in the order and contact with results.

## 2019-06-20 ENCOUNTER — Telehealth: Payer: Self-pay | Admitting: Urology

## 2019-06-20 NOTE — Telephone Encounter (Signed)
Called pt she states that she is currently experiencing vertigo and has had to reschedule her MRI. She wanted to make [place] aware. Pt has already rescheduled MRI for 07/05/19.

## 2019-06-20 NOTE — Telephone Encounter (Signed)
Pt called and states that she is having vertigo and does not believe she will be able to go forth with the MRI on 06/23/2019. She would like a call back to discuss.

## 2019-06-23 ENCOUNTER — Ambulatory Visit: Payer: Medicare Other

## 2019-06-30 DIAGNOSIS — D692 Other nonthrombocytopenic purpura: Secondary | ICD-10-CM | POA: Insufficient documentation

## 2019-07-05 ENCOUNTER — Ambulatory Visit: Payer: Medicare Other

## 2019-08-22 ENCOUNTER — Other Ambulatory Visit: Payer: Self-pay | Admitting: *Deleted

## 2019-08-22 MED ORDER — MIRABEGRON ER 25 MG PO TB24: 25.0000 mg | ORAL_TABLET | Freq: Every day | ORAL | 1 refills | Status: DC

## 2019-11-16 ENCOUNTER — Ambulatory Visit (INDEPENDENT_AMBULATORY_CARE_PROVIDER_SITE_OTHER): Payer: Medicare Other | Admitting: Podiatry

## 2019-11-16 ENCOUNTER — Other Ambulatory Visit: Payer: Self-pay

## 2019-11-16 ENCOUNTER — Encounter: Payer: Self-pay | Admitting: Podiatry

## 2019-11-16 DIAGNOSIS — M778 Other enthesopathies, not elsewhere classified: Secondary | ICD-10-CM

## 2019-11-16 NOTE — Progress Notes (Signed)
She presents today for follow-up of her painful neuroma third interdigital space right greater than left.  States that I still has the pain never really went away just moved over right here as she points beneath the third knuckle of the right foot.  Objective: Vital signs are stable alert oriented x3.  Pulses are palpable.  She has minimal pain on palpation to the third intermetatarsal space majority of her pain is on palpation of the joint itself.  Assessment: Capsulitis third metatarsophalangeal joint right foot.  Plan: I injected dexamethasone and local anesthetic to the point of maximal tenderness through the plantar aspect of the foot after sterile Betadine skin prep.  2 mg was utilized.  I will follow-up with her in about 6 weeks if she is not improved consider MRI.

## 2019-12-02 ENCOUNTER — Other Ambulatory Visit: Payer: Self-pay | Admitting: Internal Medicine

## 2019-12-02 DIAGNOSIS — Z1231 Encounter for screening mammogram for malignant neoplasm of breast: Secondary | ICD-10-CM

## 2019-12-25 ENCOUNTER — Other Ambulatory Visit: Payer: Self-pay | Admitting: Urology

## 2019-12-28 ENCOUNTER — Ambulatory Visit (INDEPENDENT_AMBULATORY_CARE_PROVIDER_SITE_OTHER): Payer: Medicare Other | Admitting: Podiatry

## 2019-12-28 ENCOUNTER — Other Ambulatory Visit: Payer: Self-pay

## 2019-12-28 ENCOUNTER — Encounter: Payer: Self-pay | Admitting: Podiatry

## 2019-12-28 DIAGNOSIS — M778 Other enthesopathies, not elsewhere classified: Secondary | ICD-10-CM | POA: Diagnosis not present

## 2019-12-28 DIAGNOSIS — M19071 Primary osteoarthritis, right ankle and foot: Secondary | ICD-10-CM

## 2019-12-28 NOTE — Progress Notes (Signed)
She presents today for follow-up of pain to the third metatarsophalangeal joint right foot and now it has started in the left foot.  She states that she tried placing the padding and would like for me to show her how to do it.  Objective: Vital signs are stable she is alert oriented x3 still has severe pain on palpation third metatarsophalangeal joints bilaterally right greater than left.  Still has pain on interdigital compression of the third interdigital space consistent with neuroma.  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  Plan: Gust etiology pathology conservative surgical therapies at this point I placed the metatarsal pads in her shoes for her so that she could visually understand where this was coming from.  I also discussed possibly getting MRI.  We have since decided on obtaining injections to her spine from her chronic pain doctor to see if that alleviated the pain in her feet.  If it does then we know that is not from the feet more than likely from the spine and will need to have a neuro consult.  However if it does not make any difference an MRI will be necessary and she will call for that we will order an MRI forefoot right secondary to chronicity of forefoot pain.

## 2020-01-12 ENCOUNTER — Ambulatory Visit
Admission: RE | Admit: 2020-01-12 | Discharge: 2020-01-12 | Disposition: A | Discharge status: Home / Self Care | Payer: Medicare Other | Source: Ambulatory Visit | Attending: Internal Medicine | Admitting: Internal Medicine

## 2020-01-12 DIAGNOSIS — Z1231 Encounter for screening mammogram for malignant neoplasm of breast: Secondary | ICD-10-CM | POA: Insufficient documentation

## 2020-01-12 HISTORY — DX: Personal history of irradiation: Z92.3

## 2020-05-07 ENCOUNTER — Encounter: Payer: Self-pay | Admitting: Urology

## 2020-05-07 ENCOUNTER — Other Ambulatory Visit: Payer: Self-pay

## 2020-05-07 ENCOUNTER — Ambulatory Visit (INDEPENDENT_AMBULATORY_CARE_PROVIDER_SITE_OTHER): Payer: Medicare Other | Admitting: Urology

## 2020-05-07 ENCOUNTER — Other Ambulatory Visit
Admission: RE | Admit: 2020-05-07 | Discharge: 2020-05-07 | Disposition: A | Discharge status: Home / Self Care | Payer: Medicare Other | Attending: Urology | Admitting: Urology

## 2020-05-07 ENCOUNTER — Telehealth: Payer: Self-pay | Admitting: Urology

## 2020-05-07 VITALS — BP 153/84 | HR 80 | Ht 65.0 in | Wt 145.0 lb

## 2020-05-07 DIAGNOSIS — N3281 Overactive bladder: Secondary | ICD-10-CM | POA: Diagnosis present

## 2020-05-07 DIAGNOSIS — N390 Urinary tract infection, site not specified: Secondary | ICD-10-CM

## 2020-05-07 DIAGNOSIS — N2889 Other specified disorders of kidney and ureter: Secondary | ICD-10-CM

## 2020-05-07 LAB — URINALYSIS, COMPLETE (UACMP) WITH MICROSCOPIC
Bilirubin Urine: NEGATIVE
Glucose, UA: NEGATIVE mg/dL
Ketones, ur: NEGATIVE mg/dL
Nitrite: NEGATIVE
Protein, ur: NEGATIVE mg/dL
Specific Gravity, Urine: 1.015 (ref 1.005–1.030)
WBC, UA: >50 WBC/hpf (ref 0–5)
pH: 6 (ref 5.0–8.0)

## 2020-05-07 LAB — BLADDER SCAN AMB NON-IMAGING

## 2020-05-07 MED ORDER — SULFAMETHOXAZOLE-TRIMETHOPRIM 800-160 MG PO TABS
1.0000 | ORAL_TABLET | Freq: Two times a day (BID) | ORAL | 0 refills | Status: DC
Start: 2020-05-07 — End: 2020-06-12

## 2020-05-07 NOTE — Progress Notes (Signed)
05/07/2020 7:26 PM   Adriana Moreno Dec 09, 1933 [id number]  Referring provider: Baxter Hire, MD [place],  [place] [address] Chief Complaint  Patient presents with  . Dysuria    follow up    HPI: Adriana Moreno is a 86 y.o. female who has a history of rUTIs and OAB returns today for evaluation of possible UTI.   She was last seen March 2019 and was on suppressive antibiotic therapy at that time. She is no longer on antibiotic suppression. She was given Myrbetriq samples for overactive bladder which she has continued.    Since August 2020 she has had 5+ urine cultures to growing E. coli and 3 growing Klebsiella aerogenes.   She last saw Dr. Bernardo Heater on 04/25/2019 and stated she did not have her typical UTI symptoms of frequency, urgency, dysuria and bladder pain.  She had no bothersome voiding symptoms and stated she would not have known she had an "infection" if she had not been told her urinalysis was abnormal. Urine culture grew E. Coli.   Recent RUS from 05/31/2019 showed rounded contour deformity of the left mid kidney, favored dromedary hump as this appears isoechoic to adjacent renal parenchyma. Difficult to exclude solid renal mass, no prior exams available for comparison. No prior exams available for comparison. Normal sonographic appearance of the right kidney and bladder.  She has experiencing dysuria, low back discomfort and lower pelvic pressure since last Wednesday. She describes discomfort as "heaviness". Denies hematuria. She did get her booster shot last Wednesday. She had chills Friday night.   She has not had much improvement on Mrybetriq in the last 4-5 months which she feels is related to her current infection. She has constant leakage. She does not have to urge to urinate.   PVR 103 mL.  UA positive for greater than 50 WBCs and many bacteria  Report some musculoskeletal issues with her spine and feet.   PMH: Past Medical History:  Diagnosis  Date  . Breast cancer ([facility]) 2003   left breast cancer  . Cancer ([facility]) lefbreast   2013  . Personal history of radiation therapy   . Recurrent UTI     Surgical History: Past Surgical History:  Procedure Laterality Date  . BREAST BIOPSY Left 2003   positive, took radiation  . BREAST BIOPSY Left 01/18/2016   stereo bx./ neg  . BREAST EXCISIONAL BIOPSY Right 2003   neg  . BREAST LUMPECTOMY Left 2003   breast ca    Home Medications:  Allergies as of 05/07/2020      Reactions   Bee Venom Anaphylaxis   Erythromycin Ethylsuccinate Nausea And Vomiting   Codeine Rash   Other Reaction: Not Assessed Other Reaction: Not Assessed Other reaction(s): UNKNOWN      Medication List       Accurate as of May 07, 2020 11:59 PM. If you have any questions, ask your nurse or doctor.        STOP taking these medications   CALCIUM + D PO Stopped by: Katrianna Friesenhahn, PA-C   Calcium Carb-Cholecalciferol [phone] MG-UNIT Tabs Stopped by: Fatmata Legere, PA-C     TAKE these medications   aspirin 81 MG tablet Take 81 mg by mouth daily.   celecoxib 200 MG capsule Commonly known as: CELEBREX Take 200 mg by mouth 2 (two) times daily.   cyanocobalamin 2000 MCG tablet Take by mouth.   CYSTEX PO Take by mouth.   indapamide 2.5 MG tablet Commonly  known as: LOZOL Take 2.5 mg by mouth daily.   LYSINE PO Take by mouth.   Magnesium 250 MG Tabs Take by mouth.   Myrbetriq 25 MG Tb24 tablet Generic drug: mirabegron ER TAKE 1 TABLET BY MOUTH  DAILY   omeprazole 20 MG capsule Commonly known as: PRILOSEC Take 20 mg by mouth daily.   potassium chloride SA 20 MEQ tablet Commonly known as: KLOR-CON   sulfamethoxazole-trimethoprim 800-160 MG tablet Commonly known as: BACTRIM DS Take 1 tablet by mouth every 12 (twelve) hours. Started by: Zara Council, PA-C   traMADol 50 MG tablet Commonly known as: ULTRAM Take 1 tablet (50 mg total) by mouth every 8 (eight) hours as  needed.   triamcinolone cream 0.1 % Commonly known as: KENALOG Apply topically.   zolpidem 10 MG tablet Commonly known as: AMBIEN Take 10 mg by mouth at bedtime as needed for sleep.       Allergies:  Allergies  Allergen Reactions  . Bee Venom Anaphylaxis  . Erythromycin Ethylsuccinate Nausea And Vomiting  . Codeine Rash    Other Reaction: Not Assessed Other Reaction: Not Assessed Other reaction(s): UNKNOWN    Family History: No family history on file.  Social History:  reports that she has never smoked. She has never used smokeless tobacco. She reports current alcohol use. She reports that she does not use drugs.   Physical Exam: BP (!) 153/84   Pulse 80   Ht 5\' 5"  (1.651 m)   Wt 145 lb (65.8 kg)   BMI 24.13 kg/m   Constitutional:  Well nourished. Alert and oriented, No acute distress. HEENT: [place] AT, mask in place.  Trachea midline Cardiovascular: No clubbing, cyanosis, or edema. Respiratory: Normal respiratory effort, no increased work of breathing. Neurologic: Grossly intact, no focal deficits, moving all 4 extremities. Psychiatric: Normal mood and affect.   Laboratory Data: Urinalysis Component     Latest Ref Rng & Units 05/07/2020  Color, Urine     YELLOW YELLOW  Appearance     CLEAR HAZY (A)  Specific Gravity, Urine     1.005 - 1.030 1.015  pH     5.0 - 8.0 6.0  Glucose, UA     NEGATIVE mg/dL NEGATIVE  Hgb urine dipstick     NEGATIVE SMALL (A)  Bilirubin Urine     NEGATIVE NEGATIVE  Ketones, ur     NEGATIVE mg/dL NEGATIVE  Protein     NEGATIVE mg/dL NEGATIVE  Nitrite     NEGATIVE NEGATIVE  Leukocytes,Ua     NEGATIVE MODERATE (A)  Squamous Epithelial / LPF     0 - 5 0-5  WBC, UA     0 - 5 WBC/hpf >50  RBC / HPF     0 - 5 RBC/hpf 0-5  Bacteria, UA     NONE SEEN MANY (A)    Results for orders placed or performed during the hospital encounter of 05/07/20  Urine Culture   Specimen: Urine, Clean Catch  Result Value Ref Range   Specimen  Description      URINE, CLEAN CATCH Performed at [facility], [address] [place], [place] [address]    Special Requests      NONE Performed at [facility], [address] [place], [place] [address]    Culture >=100,000 COLONIES/mL ESCHERICHIA COLI (A)    Report Status 05/10/2020 FINAL    Organism ID, Bacteria ESCHERICHIA COLI (A)       Susceptibility  Escherichia coli - MIC*    AMPICILLIN 8 SENSITIVE Sensitive     CEFAZOLIN <=4 SENSITIVE Sensitive     CEFTRIAXONE <=0.25 SENSITIVE Sensitive     CIPROFLOXACIN >=4 RESISTANT Resistant     GENTAMICIN <=1 SENSITIVE Sensitive     IMIPENEM <=0.25 SENSITIVE Sensitive     NITROFURANTOIN <=16 SENSITIVE Sensitive     TRIMETH/SULFA >=320 RESISTANT Resistant     AMPICILLIN/SULBACTAM <=2 SENSITIVE Sensitive     PIP/TAZO <=4 SENSITIVE Sensitive     * >=100,000 COLONIES/mL ESCHERICHIA COLI  Urinalysis, Complete w Microscopic  Result Value Ref Range   Color, Urine YELLOW YELLOW   APPearance HAZY (A) CLEAR   Specific Gravity, Urine 1.015 1.005 - 1.030   pH 6.0 5.0 - 8.0   Glucose, UA NEGATIVE NEGATIVE mg/dL   Hgb urine dipstick SMALL (A) NEGATIVE   Bilirubin Urine NEGATIVE NEGATIVE   Ketones, ur NEGATIVE NEGATIVE mg/dL   Protein, ur NEGATIVE NEGATIVE mg/dL   Nitrite NEGATIVE NEGATIVE   Leukocytes,Ua MODERATE (A) NEGATIVE   Squamous Epithelial / LPF 0-5 0 - 5   WBC, UA >50 0 - 5 WBC/hpf   RBC / HPF 0-5 0 - 5 RBC/hpf   Bacteria, UA MANY (A) NONE SEEN   Pertinent Imaging Results for CARRA, BRINDLEY (MRN [id number]) as of 05/24/2020 19:28  Ref. Range 05/07/2020 15:07  Scan Result Unknown 1109ml    Assessment & Plan:    1. rUTIs/asymptomatic bacteriuria  UA today is consistent with infection. -Urine culture sent.  -Will treat with Septra to resolve UTI symptoms. Advised to return to our office specifically if she has any UTI type symptoms   2. Overactive bladder (OAB) On Myrbetriq.   3. Dromedary  hump Patient did not have follow up imaging due to vertigo. Patient would like to defer from imaging at this time due to musculoskeletal spine and feet issues.  Return for pending urine culture results .  [facility] [address], [place], [place] [address] [phone]  I, Selena Batten, am acting as a scribe for [employer],  I have reviewed the above documentation for accuracy and completeness, and I agree with the above.    Zara Council, PA-C

## 2020-05-07 NOTE — Telephone Encounter (Signed)
Patient called the office this afternoon with complaint of possible UTI (burning during urination).  I added the patient to Shannon's schedule this afternoon.  Patient will go to the lab to provide a urine specimen prior to coming to the office for appointment.

## 2020-05-10 ENCOUNTER — Telehealth: Payer: Self-pay | Admitting: Family Medicine

## 2020-05-10 LAB — URINE CULTURE: Culture: >=100000 — AB

## 2020-05-10 MED ORDER — NITROFURANTOIN MONOHYD MACRO 100 MG PO CAPS: 100.0000 mg | ORAL_CAPSULE | Freq: Two times a day (BID) | ORAL | 0 refills | Status: DC

## 2020-05-10 NOTE — Telephone Encounter (Signed)
Patient notified and new ABX was sent to pharmacy and appointment has been made.

## 2020-05-10 NOTE — Telephone Encounter (Signed)
-----   Message from Nori Riis, PA-C sent at 05/10/2020  8:21 AM EDT ----- Please let Adriana Moreno know that her urine culture is positive for infection, but the E. coli that grew out is resistant to the antibiotic I prescribed to her.  I would like her to stop the Septra at this time and start nitrofurantoin 100 mg twice daily for 7 days.  I would also like to see her in a month for an OAB questionnaire and PVR to see if the Myrbetriq regains effectiveness after her UTI has been treated.

## 2020-06-11 NOTE — Progress Notes (Signed)
05/07/2020 10:33 AM   Adriana Moreno 1934-01-15 [id number]  Referring provider: Baxter Hire, MD [place],  [place] [address] Chief Complaint  Patient presents with  . Over Active Bladder    HPI: Adriana Moreno is a 86 y.o. female who has a history of rUTIs and OAB returns today for evaluation of possible UTI.   Recent RUS from 05/31/2019 showed rounded contour deformity of the left mid kidney, favored dromedary hump as this appears isoechoic to adjacent renal parenchyma. Difficult to exclude solid renal mass, no prior exams available for comparison. No prior exams available for comparison. Normal sonographic appearance of the right kidney and bladder.  The patient is  experiencing urgency x 4-7, frequency x 4-7, not restricting fluids to avoid visits to the restroom, not engaging in toilet mapping, incontinence x 8 or more and nocturia x 0-3.   Her BP is 165/79.   Her PVR is 13 mL.  She wears a pad 24/7.  Leaks when her feet hit the floor in the morning.   Patient denies any modifying or aggravating factors.  Patient denies any gross hematuria, dysuria or suprapubic/flank pain.  Patient denies any fevers, chills, nausea or vomiting.   PMH: Past Medical History:  Diagnosis Date  . Breast cancer ([facility]) 2003   left breast cancer  . Cancer ([facility]) lefbreast   2013  . Personal history of radiation therapy   . Recurrent UTI     Surgical History: Past Surgical History:  Procedure Laterality Date  . BREAST BIOPSY Left 2003   positive, took radiation  . BREAST BIOPSY Left 01/18/2016   stereo bx./ neg  . BREAST EXCISIONAL BIOPSY Right 2003   neg  . BREAST LUMPECTOMY Left 2003   breast ca    Home Medications:  Allergies as of 06/12/2020      Reactions   Bee Venom Anaphylaxis   Erythromycin Ethylsuccinate Nausea And Vomiting   Codeine Rash   Other Reaction: Not Assessed Other Reaction: Not Assessed Other reaction(s): UNKNOWN   Septra  [sulfamethoxazole-trimethoprim] Nausea Only      Medication List       Accurate as of June 12, 2020 10:33 AM. If you have any questions, ask your nurse or doctor.        STOP taking these medications   CYSTEX PO Stopped by: Viveka Wilmeth, PA-C   nitrofurantoin (macrocrystal-monohydrate) 100 MG capsule Commonly known as: MACROBID Stopped by: Irianna Gilday, PA-C   sulfamethoxazole-trimethoprim 800-160 MG tablet Commonly known as: BACTRIM DS Stopped by: Trace Cederberg, PA-C     TAKE these medications   ALPRAZolam 0.25 MG tablet Commonly known as: XANAX Take by mouth.   aspirin 81 MG tablet Take 81 mg by mouth daily.   celecoxib 200 MG capsule Commonly known as: CELEBREX Take 200 mg by mouth 2 (two) times daily.   cyanocobalamin 2000 MCG tablet Take by mouth.   indapamide 2.5 MG tablet Commonly known as: LOZOL Take 2.5 mg by mouth daily.   LYSINE PO Take by mouth.   Magnesium 250 MG Tabs Take by mouth.   Myrbetriq 25 MG Tb24 tablet Generic drug: mirabegron ER TAKE 1 TABLET BY MOUTH  DAILY   omeprazole 20 MG capsule Commonly known as: PRILOSEC Take 20 mg by mouth daily.   potassium chloride SA 20 MEQ tablet Commonly known as: KLOR-CON   traMADol 50 MG tablet Commonly known as: ULTRAM Take 1 tablet (50 mg total) by mouth every 8 (eight) hours  as needed.   triamcinolone 0.1 % Commonly known as: KENALOG Apply topically.   zolpidem 10 MG tablet Commonly known as: AMBIEN Take 10 mg by mouth at bedtime as needed for sleep.       Allergies:  Allergies  Allergen Reactions  . Bee Venom Anaphylaxis  . Erythromycin Ethylsuccinate Nausea And Vomiting  . Codeine Rash    Other Reaction: Not Assessed Other Reaction: Not Assessed Other reaction(s): UNKNOWN  . Septra [Sulfamethoxazole-Trimethoprim] Nausea Only    Family History: History reviewed. No pertinent family history.  Social History:  reports that she has never smoked. She has never  used smokeless tobacco. She reports current alcohol use. She reports that she does not use drugs.   Physical Exam: BP (!) 165/79   Pulse 82   Ht 5\' 5"  (1.651 m)   Wt 145 lb (65.8 kg)   BMI 24.13 kg/m   Constitutional:  Well nourished. Alert and oriented, No acute distress. HEENT: [place] AT, mask in place.  Trachea midline Cardiovascular: No clubbing, cyanosis, or edema. Respiratory: Normal respiratory effort, no increased work of breathing. Neurologic: Grossly intact, no focal deficits, moving all 4 extremities. Psychiatric: Normal mood and affect.   Laboratory Data: Urinalysis Component     Latest Ref Rng & Units 05/07/2020  Color, Urine     YELLOW YELLOW  Appearance     CLEAR HAZY (A)  Specific Gravity, Urine     1.005 - 1.030 1.015  pH     5.0 - 8.0 6.0  Glucose, UA     NEGATIVE mg/dL NEGATIVE  Hgb urine dipstick     NEGATIVE SMALL (A)  Bilirubin Urine     NEGATIVE NEGATIVE  Ketones, ur     NEGATIVE mg/dL NEGATIVE  Protein     NEGATIVE mg/dL NEGATIVE  Nitrite     NEGATIVE NEGATIVE  Leukocytes,Ua     NEGATIVE MODERATE (A)  Squamous Epithelial / LPF     0 - 5 0-5  WBC, UA     0 - 5 WBC/hpf >50  RBC / HPF     0 - 5 RBC/hpf 0-5  Bacteria, UA     NONE SEEN MANY (A)   I have reviewed the labs.  Pertinent Imaging Results for HAIZLEY, Moreno (MRN [id number]) as of 06/12/2020 10:19  Ref. Range 06/12/2020 09:53  Scan Result Unknown 13     Assessment & Plan:    1. rUTIs/asymptomatic bacteriuria  Asymptomatic at this visit Patient to report to our office for any UTIs    2. Overactive bladder (OAB) On Myrbetriq She feels her biggest issue is that she gets busy and forgets to use the restroom causing urgency and urge incontinence She will try to be more vigilant with timed voiding's and continue the Myrbetriq She will follow-up next year for PVR and symptom recheck  3. Dromedary hump We will obtain a repeat renal ultrasound for surveillance of the dromedary  hump/mass I will call her with results   Return in about 1 year (around 06/12/2021) for PVR and symptom recheck .  [facility] [address] [place],  [address] [phone]   Zara Council, PA-C

## 2020-06-12 ENCOUNTER — Other Ambulatory Visit: Payer: Self-pay

## 2020-06-12 ENCOUNTER — Ambulatory Visit (INDEPENDENT_AMBULATORY_CARE_PROVIDER_SITE_OTHER): Payer: Medicare Other | Admitting: Urology

## 2020-06-12 ENCOUNTER — Encounter: Payer: Self-pay | Admitting: Urology

## 2020-06-12 VITALS — BP 165/79 | HR 82 | Ht 65.0 in | Wt 145.0 lb

## 2020-06-12 DIAGNOSIS — N3281 Overactive bladder: Secondary | ICD-10-CM

## 2020-06-12 DIAGNOSIS — N390 Urinary tract infection, site not specified: Secondary | ICD-10-CM | POA: Diagnosis not present

## 2020-06-12 DIAGNOSIS — R8271 Bacteriuria: Secondary | ICD-10-CM

## 2020-06-12 DIAGNOSIS — N2889 Other specified disorders of kidney and ureter: Secondary | ICD-10-CM | POA: Diagnosis not present

## 2020-06-12 LAB — BLADDER SCAN AMB NON-IMAGING: Scan Result: 13

## 2020-08-22 ENCOUNTER — Other Ambulatory Visit: Payer: Self-pay

## 2020-08-22 ENCOUNTER — Encounter: Payer: Self-pay | Admitting: Podiatry

## 2020-08-22 ENCOUNTER — Ambulatory Visit (INDEPENDENT_AMBULATORY_CARE_PROVIDER_SITE_OTHER): Payer: Medicare Other | Admitting: Podiatry

## 2020-08-22 DIAGNOSIS — G5763 Lesion of plantar nerve, bilateral lower limbs: Secondary | ICD-10-CM

## 2020-08-22 DIAGNOSIS — M778 Other enthesopathies, not elsewhere classified: Secondary | ICD-10-CM

## 2020-08-24 ENCOUNTER — Encounter: Payer: Self-pay | Admitting: Podiatry

## 2020-08-24 NOTE — Progress Notes (Signed)
  Subjective:  Patient ID: Adriana Moreno, female    DOB: [birth year],  MRN: [id number]  Chief Complaint  Patient presents with  . Foot Pain    Patient presents today for pain bilat tops of feet between toes 3 and 4 and forefeet   She says it burns like fire and is sharp and stabbing, worse when she gets in the bed and is painful upon standing    87 y.o. female presents with the above complaint. History confirmed with patient.   Objective:  Physical Exam: warm, good capillary refill, no trophic changes or ulcerative lesions, normal DP and PT pulses and normal sensory exam.  She has pain on palpation of the bilateral plantar interspace and also on the third metatarsophalangeal joint plantarly, diffuse and somewhat nonspecific  Assessment:   1. Morton's neuroma of both feet      Plan:  Patient was evaluated and treated and all questions answered.  Discussed with her that I agree with Dr. Stephenie Acres previous plan that we should evaluate if this is actually coming from her back.  She is due for her chronic pain injection on Friday.  She has not had one since last visit when she saw Dr. Milinda Pointer.  I recommend that she proceed with this to see if this alleviates any of her pain.  If it does not she will call back and we will order MRIs on both feet to evaluate for presence of neuroma and then plan further treatment.  No follow-ups on file.

## 2020-10-18 ENCOUNTER — Telehealth: Payer: Self-pay | Admitting: Urology

## 2020-10-18 DIAGNOSIS — N2889 Other specified disorders of kidney and ureter: Secondary | ICD-10-CM

## 2020-10-18 NOTE — Telephone Encounter (Signed)
Would you call Mrs. Strine and let her know that she did not have her kidney ultrasound in last November?  We need to get one done at this time.  I would like it done before her appointment in November this year.

## 2020-10-19 NOTE — Telephone Encounter (Signed)
Unable to leave message, no voicemail set up.

## 2020-10-22 NOTE — Telephone Encounter (Signed)
.  left message to have patient return my call.  

## 2020-10-26 NOTE — Telephone Encounter (Signed)
Spoke with patient and she never got a call about the renal ultrasound being scheduled. Pt also would like to switch back to Dr. Bernardo Heater. I advised that we still need to get U/S and she agreed but would like to follow-up with Dr. Bernardo Heater instead. Renal U/S order entered.

## 2020-12-03 ENCOUNTER — Other Ambulatory Visit: Payer: Self-pay | Admitting: Urology

## 2020-12-25 ENCOUNTER — Other Ambulatory Visit: Payer: Self-pay | Admitting: Family Medicine

## 2020-12-25 ENCOUNTER — Other Ambulatory Visit: Payer: Self-pay | Admitting: Internal Medicine

## 2020-12-25 DIAGNOSIS — Z1231 Encounter for screening mammogram for malignant neoplasm of breast: Secondary | ICD-10-CM

## 2021-01-03 ENCOUNTER — Ambulatory Visit
Admission: RE | Admit: 2021-01-03 | Discharge: 2021-01-03 | Disposition: A | Discharge status: Home / Self Care | Payer: Medicare Other | Source: Ambulatory Visit | Attending: Urology | Admitting: Urology

## 2021-01-03 ENCOUNTER — Other Ambulatory Visit: Payer: Self-pay

## 2021-01-03 ENCOUNTER — Telehealth: Payer: Self-pay | Admitting: *Deleted

## 2021-01-03 DIAGNOSIS — N2889 Other specified disorders of kidney and ureter: Secondary | ICD-10-CM

## 2021-01-03 NOTE — Telephone Encounter (Signed)
Notified patient as instructed, patient pleased. Discussed follow-up appointments, patient agrees  

## 2021-01-03 NOTE — Telephone Encounter (Signed)
-----   Message from [name], MD sent at 01/03/2021  4:06 PM EDT ----- Renal ultrasound showed no significant changes and the area and the left kidney is felt to represent normal kidney and not a mass.  Follow-up as scheduled

## 2021-01-15 ENCOUNTER — Other Ambulatory Visit: Payer: Self-pay

## 2021-01-15 ENCOUNTER — Ambulatory Visit
Admission: RE | Admit: 2021-01-15 | Discharge: 2021-01-15 | Disposition: A | Discharge status: Home / Self Care | Payer: Medicare Other | Source: Ambulatory Visit | Attending: Internal Medicine | Admitting: Internal Medicine

## 2021-01-15 DIAGNOSIS — Z1231 Encounter for screening mammogram for malignant neoplasm of breast: Secondary | ICD-10-CM | POA: Diagnosis present

## 2021-04-26 ENCOUNTER — Ambulatory Visit (INDEPENDENT_AMBULATORY_CARE_PROVIDER_SITE_OTHER): Payer: Medicare Other | Admitting: Urology

## 2021-04-26 ENCOUNTER — Other Ambulatory Visit: Payer: Self-pay

## 2021-04-26 ENCOUNTER — Encounter: Payer: Self-pay | Admitting: Urology

## 2021-04-26 VITALS — BP 177/90 | HR 90 | Ht 66.0 in | Wt 135.0 lb

## 2021-04-26 DIAGNOSIS — N390 Urinary tract infection, site not specified: Secondary | ICD-10-CM

## 2021-04-26 DIAGNOSIS — N3281 Overactive bladder: Secondary | ICD-10-CM

## 2021-04-26 LAB — URINALYSIS, COMPLETE
Bilirubin, UA: NEGATIVE
Glucose, UA: NEGATIVE
Ketones, UA: NEGATIVE
Nitrite, UA: NEGATIVE
Protein,UA: NEGATIVE
Specific Gravity, UA: 1.015 (ref 1.005–1.030)
Urobilinogen, Ur: 0.2 mg/dL (ref 0.2–1.0)
pH, UA: 6 (ref 5.0–7.5)

## 2021-04-26 LAB — MICROSCOPIC EXAMINATION: WBC, UA: >30 /hpf — AB (ref 0–5)

## 2021-04-26 LAB — BLADDER SCAN AMB NON-IMAGING

## 2021-04-26 MED ORDER — MIRABEGRON ER 50 MG PO TB24: 50.0000 mg | ORAL_TABLET | Freq: Every day | ORAL | 0 refills | Status: DC

## 2021-04-26 NOTE — Progress Notes (Signed)
04/26/2021 1:54 PM   Adriana Moreno 01/31/1934 [id number]  Referring provider: Baxter Hire, MD [place],  [place] [address]  Chief Complaint  Patient presents with   Follow-up   Urinary Tract Infection    Urologic history:  1.  Overactive bladder Myrbetriq 25 mg  2.  Recurrent UTI   HPI: 87 y.o. female requested follow-up of recurrent UTIs.  + urine cultures for E. coli in June, July and September 2022 Symptoms improved with antibiotics and then recur No fever/chills Denies gross hematuria Presently with mild dysuria Last antibiotic treatment was a 5-day course of Keflex twice daily 04/02/2021   PMH: Past Medical History:  Diagnosis Date   Breast cancer ([facility]) 2003   left breast cancer   Cancer ([facility]) lefbreast   2013   Personal history of radiation therapy    Recurrent UTI     Surgical History: Past Surgical History:  Procedure Laterality Date   BREAST BIOPSY Left 2003   positive, took radiation   BREAST BIOPSY Left 01/18/2016   stereo bx./ neg   BREAST EXCISIONAL BIOPSY Right 2003   neg   BREAST LUMPECTOMY Left 2003   breast ca    Home Medications:  Allergies as of 04/26/2021       Reactions   Bee Venom Anaphylaxis   Erythromycin Ethylsuccinate Nausea And Vomiting   Codeine Rash   Other Reaction: Not Assessed Other Reaction: Not Assessed Other reaction(s): UNKNOWN   Septra [sulfamethoxazole-trimethoprim] Nausea Only        Medication List        Accurate as of April 26, 2021  1:54 PM. If you have any questions, ask your nurse or doctor.          aspirin 81 MG tablet Take 81 mg by mouth daily.   celecoxib 200 MG capsule Commonly known as: CELEBREX Take 200 mg by mouth 2 (two) times daily.   cyanocobalamin 2000 MCG tablet Take by mouth.   indapamide 2.5 MG tablet Commonly known as: LOZOL Take 2.5 mg by mouth daily.   Magnesium 250 MG Tabs Take by mouth.   Myrbetriq 25 MG Tb24 tablet Generic drug:  mirabegron ER TAKE 1 TABLET BY MOUTH  DAILY   omeprazole 20 MG capsule Commonly known as: PRILOSEC Take 20 mg by mouth daily.   traMADol 50 MG tablet Commonly known as: ULTRAM Take 1 tablet (50 mg total) by mouth every 8 (eight) hours as needed.   zolpidem 10 MG tablet Commonly known as: AMBIEN Take 10 mg by mouth at bedtime as needed for sleep.        Allergies:  Allergies  Allergen Reactions   Bee Venom Anaphylaxis   Erythromycin Ethylsuccinate Nausea And Vomiting   Codeine Rash    Other Reaction: Not Assessed Other Reaction: Not Assessed Other reaction(s): UNKNOWN   Septra [Sulfamethoxazole-Trimethoprim] Nausea Only    Family History: Family History  Problem Relation Age of Onset   Breast cancer Neg Hx     Social History:  reports that she has never smoked. She has never used smokeless tobacco. She reports current alcohol use. She reports that she does not use drugs.   Physical Exam: BP (!) 177/90   Pulse 90   Ht 5\' 6"  (1.676 m)   Wt 135 lb (61.2 kg)   BMI 21.79 kg/m   Constitutional:  Alert and oriented, No acute distress. HEENT: [place] AT, moist mucus membranes.  Trachea midline, no masses. Cardiovascular: No clubbing, cyanosis,  or edema. Respiratory: Normal respiratory effort, no increased work of breathing. Psychiatric: Normal mood and affect.  Laboratory Data:  Urinalysis 04/26/2021: Dipstick trace blood/1+ leukocyte/cloudy; microscopy >30 WBC   Assessment & Plan:    1. Recurrent UTI Recent increase frequency of recurrence UA today with significant pyuria; urine culture ordered Will await results prior to antibiotic therapy as symptoms are minimal at present Follow-up 1 month  She has previously done well with low-dose antibiotic suppression and consider restarting low-dose antibiotic suppression for continued infections  2.  Overactive bladder She was inquiring she would achieve better efficacy with the Myrbetriq 50 mg dose.  She was given  samples x4 weeks and if effective will increase to 50 mg       No follow-ups on file.  [name], [facility] [address] [place], [place] [address] [phone]

## 2021-04-29 ENCOUNTER — Telehealth: Payer: Self-pay | Admitting: Urology

## 2021-04-29 LAB — CULTURE, URINE COMPREHENSIVE

## 2021-04-29 MED ORDER — DOXYCYCLINE HYCLATE 100 MG PO CAPS: 100.0000 mg | ORAL_CAPSULE | Freq: Two times a day (BID) | ORAL | 0 refills | Status: AC

## 2021-04-29 NOTE — Telephone Encounter (Signed)
Urine culture was positive.  Rx doxycycline x10 days sent to pharmacy.  Keep follow-up appointment as scheduled

## 2021-04-29 NOTE — Telephone Encounter (Signed)
Notified patient as instructed, patient pleased. Discussed follow-up appointments, patient agrees  

## 2021-06-18 ENCOUNTER — Ambulatory Visit: Payer: Self-pay | Admitting: Urology

## 2021-06-19 ENCOUNTER — Ambulatory Visit (INDEPENDENT_AMBULATORY_CARE_PROVIDER_SITE_OTHER): Payer: Medicare Other | Admitting: Urology

## 2021-06-19 ENCOUNTER — Encounter: Payer: Self-pay | Admitting: Urology

## 2021-06-19 ENCOUNTER — Other Ambulatory Visit: Payer: Self-pay

## 2021-06-19 VITALS — BP 164/86 | HR 79 | Ht 66.0 in | Wt 145.0 lb

## 2021-06-19 DIAGNOSIS — N390 Urinary tract infection, site not specified: Secondary | ICD-10-CM | POA: Diagnosis not present

## 2021-06-19 DIAGNOSIS — N3281 Overactive bladder: Secondary | ICD-10-CM

## 2021-06-19 LAB — BLADDER SCAN AMB NON-IMAGING: Scan Result: 0

## 2021-06-19 MED ORDER — NITROFURANTOIN MACROCRYSTAL 50 MG PO CAPS: 50.0000 mg | ORAL_CAPSULE | Freq: Every day | ORAL | 1 refills | Status: AC

## 2021-06-19 NOTE — Progress Notes (Signed)
06/19/2021 10:15 AM   Adriana Moreno [birth year] [id number]  Referring provider: Baxter Hire, MD [place],  [place] [address]  Chief Complaint  Patient presents with   Over Active Bladder    Urologic history:   1.  Overactive bladder Myrbetriq 25 mg   2.  Recurrent UTI  HPI: 87 y.o. female presents for 1 month follow-up.  Refer to prior note 04/26/2021 Urine culture at that visit + E. Coli Treated with doxycycline however after 5 days she had to discontinue secondary to esophagitis Was seen by her PCP; states she was placed on an alternative antibiotic though unable to tell in chart review what was prescribed She desires to restart low-dose antibiotic suppression which worked well in the past Instead of taking Myrbetriq 50 mg she elected to take 25 mg twice daily and she feels this works better than 25 mg daily   PMH: Past Medical History:  Diagnosis Date   Breast cancer ([facility]) 2003   left breast cancer   Cancer ([facility]) lefbreast   2013   Personal history of radiation therapy    Recurrent UTI     Surgical History: Past Surgical History:  Procedure Laterality Date   BREAST BIOPSY Left 2003   positive, took radiation   BREAST BIOPSY Left 01/18/2016   stereo bx./ neg   BREAST EXCISIONAL BIOPSY Right 2003   neg   BREAST LUMPECTOMY Left 2003   breast ca    Home Medications:  Allergies as of 06/19/2021       Reactions   Bee Venom Anaphylaxis   Erythromycin Ethylsuccinate Nausea And Vomiting   Codeine Rash   Other Reaction: Not Assessed Other Reaction: Not Assessed Other reaction(s): UNKNOWN   Septra [sulfamethoxazole-trimethoprim] Nausea Only        Medication List        Accurate as of June 19, 2021 10:15 AM. If you have any questions, ask your nurse or doctor.          ALPRAZolam 0.25 MG tablet Commonly known as: XANAX Take 0.25 mg by mouth daily as needed.   aspirin 81 MG tablet Take 81 mg by mouth daily.    celecoxib 200 MG capsule Commonly known as: CELEBREX Take 200 mg by mouth 2 (two) times daily.   cyanocobalamin 2000 MCG tablet Take by mouth.   indapamide 2.5 MG tablet Commonly known as: LOZOL Take 2.5 mg by mouth daily.   Magnesium 250 MG Tabs Take by mouth.   mirabegron ER 50 MG Tb24 tablet Commonly known as: MYRBETRIQ Take 1 tablet (50 mg total) by mouth daily.   omeprazole 20 MG capsule Commonly known as: PRILOSEC Take 20 mg by mouth daily.   traMADol 50 MG tablet Commonly known as: ULTRAM Take 1 tablet (50 mg total) by mouth every 8 (eight) hours as needed.   zolpidem 10 MG tablet Commonly known as: AMBIEN Take 10 mg by mouth at bedtime as needed for sleep.        Allergies:  Allergies  Allergen Reactions   Bee Venom Anaphylaxis   Erythromycin Ethylsuccinate Nausea And Vomiting   Codeine Rash    Other Reaction: Not Assessed Other Reaction: Not Assessed Other reaction(s): UNKNOWN   Septra [Sulfamethoxazole-Trimethoprim] Nausea Only    Family History: Family History  Problem Relation Age of Onset   Breast cancer Neg Hx     Social History:  reports that she has never smoked. She has never used smokeless tobacco. She reports current  alcohol use. She reports that she does not use drugs.   Physical Exam: BP (!) 164/86   Pulse 79   Ht 5\' 6"  (1.676 m)   Wt 145 lb (65.8 kg)   BMI 23.40 kg/m   Constitutional:  Alert and oriented, No acute distress. HEENT: [place] AT, moist mucus membranes.  Trachea midline, no masses. Cardiovascular: No clubbing, cyanosis, or edema. Respiratory: Normal respiratory effort, no increased work of breathing. Neurologic: Grossly intact, no focal deficits, moving all 4 extremities. Psychiatric: Normal mood and affect.   Assessment & Plan:    1. Overactive bladder Bladder scan PVR was 0 mL She has elected to take Myrbetriq 25 mg twice daily  2. Recurrent UTI Had previously been on nitrofurantoin which she states worked  well.  We discussed black box warning nitrofurantoin regarding pulmonary fibrosis which does not resolve after stopping the medication.  Based on her allergies and antibiotic intolerance there are not a lot of additional options for low-dose suppression.  She desires to start back on nitrofurantoin despite the incidence of pulmonary fibrosis Follow-up 6 months or earlier for persistent symptoms   [name], MD  [facility] [address], [place], [place] [address] [phone]

## 2021-08-27 ENCOUNTER — Other Ambulatory Visit: Payer: Self-pay | Admitting: Family Medicine

## 2021-08-27 MED ORDER — MIRABEGRON ER 25 MG PO TB24: 25.0000 mg | ORAL_TABLET | Freq: Every day | ORAL | 3 refills | Status: DC

## 2021-08-27 NOTE — Telephone Encounter (Signed)
Patient called and states she has run out of the Mybetriq and it has been working well in combination with Nitrofurantoin. She requested a refill of this medication to be sent to [facility]. Myrbetriq 25mg  has been sent to pharmacy.

## 2021-10-28 ENCOUNTER — Other Ambulatory Visit: Payer: Self-pay | Admitting: Urology

## 2021-11-18 DEATH — deceased

## 2021-12-18 ENCOUNTER — Ambulatory Visit: Payer: Medicare Other | Admitting: Urology

## 2021-12-20 ENCOUNTER — Encounter: Payer: Self-pay | Admitting: Urology
